# Patient Record
Sex: Male | Born: 1970 | Race: White | Hispanic: No | Marital: Married
Health system: Midwestern US, Community
[De-identification: ages and names within clinical notes are randomized; demographics above are authoritative.]

## PROBLEM LIST (undated history)

## (undated) DIAGNOSIS — A539 Syphilis, unspecified: Secondary | ICD-10-CM

## (undated) DIAGNOSIS — E162 Hypoglycemia, unspecified: Secondary | ICD-10-CM

---

## 2016-09-29 ENCOUNTER — Encounter (HOSPITAL_COMMUNITY): Payer: Self-pay | Admitting: Emergency Medicine

## 2016-09-29 ENCOUNTER — Emergency Department (HOSPITAL_COMMUNITY)
Admission: EM | Admit: 2016-09-29 | Discharge: 2016-09-29 | Disposition: A | Discharge status: Home / Self Care | Payer: Medicare Other | Attending: Emergency Medicine | Admitting: Emergency Medicine

## 2016-09-29 ENCOUNTER — Emergency Department (HOSPITAL_COMMUNITY): Payer: Medicare Other

## 2016-09-29 DIAGNOSIS — W278XXA Contact with other nonpowered hand tool, initial encounter: Secondary | ICD-10-CM | POA: Diagnosis not present

## 2016-09-29 DIAGNOSIS — Y9389 Activity, other specified: Secondary | ICD-10-CM | POA: Insufficient documentation

## 2016-09-29 DIAGNOSIS — Y998 Other external cause status: Secondary | ICD-10-CM | POA: Diagnosis not present

## 2016-09-29 DIAGNOSIS — S61211A Laceration without foreign body of left index finger without damage to nail, initial encounter: Secondary | ICD-10-CM | POA: Diagnosis not present

## 2016-09-29 DIAGNOSIS — F17210 Nicotine dependence, cigarettes, uncomplicated: Secondary | ICD-10-CM | POA: Diagnosis not present

## 2016-09-29 DIAGNOSIS — S60941A Unspecified superficial injury of left index finger, initial encounter: Secondary | ICD-10-CM | POA: Diagnosis present

## 2016-09-29 DIAGNOSIS — Y929 Unspecified place or not applicable: Secondary | ICD-10-CM | POA: Diagnosis not present

## 2016-09-29 MED ORDER — LIDOCAINE HCL (PF) 1 % IJ SOLN
INTRAMUSCULAR | Status: AC
  Filled 2016-09-29: qty 5

## 2016-09-29 NOTE — ED Provider Notes (Signed)
[facility]-EMER[facility]Y DEPT Provider Note   CSN: [id number] Arrival date & time: 09/29/16  2100     History   Chief Complaint Chief Complaint  Patient presents with  . Finger Injury    HPI Issaih Kaus is a 46 y.o. male.  Patient is a 46 year old male who presents to the emergency department with a complaint of injury to his finger.  The patient states that approximately 2 hours prior to his arrival to the emergency department he injured his left index finger with a hammer. He states that his last tetanus shot was about 2 years ago. He denies being on any anticoagulation medications. He denies any recent operations or procedures involving the index finger. No other injury reported. The patient is not taken any medication up to this point for this problem.   The history is provided by the patient.    History reviewed. No pertinent past medical history.  There are no active problems to display for this patient.   History reviewed. No pertinent surgical history.     Home Medications    Prior to Admission medications   Not on File    Family History No family history on file.  Social History Social History  Substance Use Topics  . Smoking status: Current Every Day Smoker    Packs/day: 0.50    Types: Cigarettes  . Smokeless tobacco: Never Used  . Alcohol use No     Allergies   Patient has no allergy information on record.   Review of Systems Review of Systems  Constitutional: Negative for activity change and appetite change.  HENT: Negative for congestion, ear discharge, ear pain, facial swelling, nosebleeds, rhinorrhea, sneezing and tinnitus.   Eyes: Negative for photophobia, pain and discharge.  Respiratory: Negative for cough, choking, shortness of breath and wheezing.   Cardiovascular: Negative for chest pain, palpitations and leg swelling.  Gastrointestinal: Negative for abdominal pain, blood in stool, constipation, diarrhea, nausea and vomiting.    Genitourinary: Negative for difficulty urinating, dysuria, flank pain, frequency and hematuria.  Musculoskeletal: Negative for back pain, gait problem, myalgias and neck pain.  Skin: Negative for color change, rash and wound.  Neurological: Negative for dizziness, seizures, syncope, facial asymmetry, speech difficulty, weakness and numbness.  Hematological: Negative for adenopathy. Does not bruise/bleed easily.  Psychiatric/Behavioral: Negative for agitation, confusion, hallucinations, self-injury and suicidal ideas. The patient is not nervous/anxious.      Physical Exam Updated Vital Signs Ht 6' (1.829 m)   Wt 72.6 kg (160 lb)   BMI 21.70 kg/m   Physical Exam  Constitutional: He is oriented to person, place, and time. He appears well-developed and well-nourished.  Non-toxic appearance.  HENT:  Head: Normocephalic.  Right Ear: Tympanic membrane and external ear normal.  Left Ear: Tympanic membrane and external ear normal.  Eyes: EOM and lids are normal. Pupils are equal, round, and reactive to light.  Neck: Normal range of motion. Neck supple. Carotid bruit is not present.  Cardiovascular: Normal rate, regular rhythm, normal heart sounds, intact distal pulses and normal pulses.   Pulmonary/Chest: Breath sounds normal. No respiratory distress.  Abdominal: Soft. Bowel sounds are normal. There is no tenderness. There is no guarding.  Musculoskeletal: Normal range of motion.  There is full range of motion of the left elbow, and wrist. There is pain with attempted range of motion of the left index finger. There is full range of motion of the other fingers of the left hand. There is a laceration of the dorsal area  of the left index finger between the DIP and PIP joint area. Bleeding is controlled.  Lymphadenopathy:       Head (right side): No submandibular adenopathy present.       Head (left side): No submandibular adenopathy present.    He has no cervical adenopathy.  Neurological: He  is alert and oriented to person, place, and time. He has normal strength. No cranial nerve deficit or sensory deficit.  Skin: Skin is warm and dry.  Psychiatric: He has a normal mood and affect. His speech is normal.  Nursing note and vitals reviewed.    ED Treatments / Results  Labs (all labs ordered are listed, but only abnormal results are displayed) Labs Reviewed - No data to display  EKG  EKG Interpretation None       Radiology No results found.  Procedures .Marland Kitchen.Laceration Repair Date/Time: 09/29/2016 10:03 PM Performed by: Ivery QualeBRYANT, Italia Wolfert Authorized by: Ivery QualeBRYANT, Varun Jourdan   Consent:    Consent obtained:  Verbal   Consent given by:  Patient   Risks discussed:  Infection, pain and poor cosmetic result Anesthesia (see MAR for exact dosages):    Anesthesia method:  Nerve block   Block needle gauge:  25 G   Block anesthetic:  Lidocaine 1% w/o epi   Block injection procedure:  Anatomic landmarks identified, introduced needle, incremental injection, anatomic landmarks palpated and negative aspiration for blood   Block outcome:  Anesthesia achieved Laceration details:    Location:  Finger   Finger location:  L index finger   Length (cm):  2.3 Repair type:    Repair type:  Simple Pre-procedure details:    Preparation:  Patient was prepped and draped in usual sterile fashion Exploration:    Hemostasis achieved with:  Direct pressure   Wound exploration: wound explored through full range of motion     Wound extent: no nerve damage noted, no tendon damage noted and no underlying fracture noted   Treatment:    Area cleansed with:  Betadine   Amount of cleaning:  Standard   Irrigation solution:  Sterile saline Skin repair:    Repair method:  Sutures   Suture size:  4-0   Suture material:  Nylon   Suture technique:  Simple interrupted   Number of sutures:  6 Approximation:    Approximation:  Close Post-procedure details:    Dressing:  Non-adherent dressing   Patient  tolerance of procedure:  Tolerated well, no immediate complications   (including critical care time)  Medications Ordered in ED Medications - No data to display   Initial Impression / Assessment and Plan / ED Course  I have reviewed the triage vital signs and the nursing notes.  Pertinent labs & imaging results that were available during my care of the patient were reviewed by me and considered in my medical decision making (see chart for details).      Final Clinical Impressions(s) / ED Diagnoses MDM Patient injured the left index finger with a hammer. X-ray is negative for fracture or dislocation. The patient's tetanus status is up-to-date on. The wound was repaired with 6 interrupted sutures of 4-0 nylon.  We discussed the importance of keeping the wound clean and dry. Discussed the need to return sooner if any signs of advancing infection. The patient will have the sutures removed in 7 or 8 days. Patient is in agreement with this plan.    Final diagnoses:  Laceration of left index finger without foreign body without damage to nail,  initial encounter    New Prescriptions New Prescriptions   No medications on file     Ke[place], [place] 09/29/16 2207    Donnetta Hutching, MD 10/06/16 [phone]

## 2016-09-29 NOTE — Discharge Instructions (Signed)
Please keep the wound clean and dry. Keep your hand elevated above your above your head is much as possible. This would decrease pain or throbbing. Use Tylenol every 4 hours, or ibuprofen every 6 hours for discomfort. Please have your sutures removed in 7 or 8 days. See your primary physician, or return to the emergency department if any signs of advancing infection.

## 2016-09-29 NOTE — ED Triage Notes (Signed)
Smashed lt index finger with hammer

## 2016-09-29 NOTE — ED Notes (Signed)
Pt smashed finger with a hammer - now with pain and swelling

## 2016-11-24 ENCOUNTER — Emergency Department (HOSPITAL_COMMUNITY): Payer: Medicare Other

## 2016-11-24 ENCOUNTER — Encounter (HOSPITAL_COMMUNITY): Payer: Self-pay | Admitting: Emergency Medicine

## 2016-11-24 ENCOUNTER — Other Ambulatory Visit: Payer: Self-pay

## 2016-11-24 ENCOUNTER — Observation Stay (HOSPITAL_COMMUNITY)
Admission: EM | Admit: 2016-11-24 | Discharge: 2016-11-25 | Disposition: A | Discharge status: Home / Self Care | Payer: Medicare Other | Attending: Internal Medicine | Admitting: Internal Medicine

## 2016-11-24 DIAGNOSIS — R072 Precordial pain: Secondary | ICD-10-CM | POA: Diagnosis not present

## 2016-11-24 DIAGNOSIS — R0602 Shortness of breath: Secondary | ICD-10-CM | POA: Diagnosis not present

## 2016-11-24 DIAGNOSIS — Z720 Tobacco use: Secondary | ICD-10-CM | POA: Diagnosis not present

## 2016-11-24 DIAGNOSIS — F17210 Nicotine dependence, cigarettes, uncomplicated: Secondary | ICD-10-CM | POA: Insufficient documentation

## 2016-11-24 DIAGNOSIS — K219 Gastro-esophageal reflux disease without esophagitis: Secondary | ICD-10-CM

## 2016-11-24 DIAGNOSIS — R079 Chest pain, unspecified: Secondary | ICD-10-CM | POA: Diagnosis present

## 2016-11-24 HISTORY — DX: Hypoglycemia, unspecified: E16.2

## 2016-11-24 HISTORY — DX: Syphilis, unspecified: A53.9

## 2016-11-24 LAB — BASIC METABOLIC PANEL
Anion gap: 8 (ref 5–15)
BUN: 13 mg/dL (ref 6–20)
CALCIUM: 9.5 mg/dL (ref 8.9–10.3)
CO2: 28 mmol/L (ref 22–32)
Chloride: 107 mmol/L (ref 101–111)
Creatinine, Ser: 0.99 mg/dL (ref 0.61–1.24)
GFR calc Af Amer: >60 mL/min (ref >60)
GLUCOSE: 85 mg/dL (ref 65–99)
Potassium: 4.1 mmol/L (ref 3.5–5.1)
Sodium: 143 mmol/L (ref 135–145)

## 2016-11-24 LAB — CBC
HEMATOCRIT: 48 % (ref 39.0–52.0)
HEMOGLOBIN: 16.6 g/dL (ref 13.0–17.0)
MCH: 31.7 pg (ref 26.0–34.0)
MCHC: 34.6 g/dL (ref 30.0–36.0)
MCV: 91.6 fL (ref 78.0–100.0)
Platelets: 271 10*3/uL (ref 150–400)
RBC: 5.24 MIL/uL (ref 4.22–5.81)
RDW: 12.9 % (ref 11.5–15.5)
WBC: 7.1 10*3/uL (ref 4.0–10.5)

## 2016-11-24 LAB — TROPONIN I
Troponin I: <0.03 ng/mL (ref <0.03)
Troponin I: <0.03 ng/mL (ref <0.03)

## 2016-11-24 MED ORDER — MORPHINE SULFATE (PF) 2 MG/ML IV SOLN: 1.0000 mg | INTRAVENOUS | Status: DC | PRN

## 2016-11-24 MED ORDER — PANTOPRAZOLE SODIUM 40 MG PO TBEC
40.0000 mg | DELAYED_RELEASE_TABLET | Freq: Two times a day (BID) | ORAL | Status: DC
  Administered 2016-11-24 – 2016-11-25 (×2): 40 mg via ORAL
  Filled 2016-11-24 (×2): qty 1

## 2016-11-24 MED ORDER — ENOXAPARIN SODIUM 40 MG/0.4ML ~~LOC~~ SOLN
40.0000 mg | SUBCUTANEOUS | Status: DC
  Administered 2016-11-24: 40 mg via SUBCUTANEOUS
  Filled 2016-11-24: qty 0.4

## 2016-11-24 MED ORDER — SUCRALFATE 1 G PO TABS
1.0000 g | ORAL_TABLET | Freq: Three times a day (TID) | ORAL | Status: DC
  Administered 2016-11-24 – 2016-11-25 (×3): 1 g via ORAL
  Filled 2016-11-24 (×3): qty 1

## 2016-11-24 MED ORDER — NITROGLYCERIN 0.4 MG SL SUBL
0.4000 mg | SUBLINGUAL_TABLET | Freq: Once | SUBLINGUAL | Status: AC
  Administered 2016-11-24: 0.4 mg via SUBLINGUAL
  Filled 2016-11-24: qty 1

## 2016-11-24 MED ORDER — ACETAMINOPHEN 325 MG PO TABS
650.0000 mg | ORAL_TABLET | ORAL | Status: DC | PRN
  Administered 2016-11-24: 650 mg via ORAL
  Filled 2016-11-24: qty 2

## 2016-11-24 MED ORDER — ASPIRIN 81 MG PO CHEW
324.0000 mg | CHEWABLE_TABLET | Freq: Once | ORAL | Status: AC
  Administered 2016-11-24: 324 mg via ORAL
  Filled 2016-11-24: qty 4

## 2016-11-24 MED ORDER — ONDANSETRON HCL 4 MG/2ML IJ SOLN: 4.0000 mg | Freq: Four times a day (QID) | INTRAMUSCULAR | Status: DC | PRN

## 2016-11-24 MED ORDER — IPRATROPIUM-ALBUTEROL 0.5-2.5 (3) MG/3ML IN SOLN
3.0000 mL | Freq: Once | RESPIRATORY_TRACT | Status: AC
  Administered 2016-11-24: 3 mL via RESPIRATORY_TRACT
  Filled 2016-11-24: qty 3

## 2016-11-24 NOTE — ED Provider Notes (Addendum)
Emergency Department Provider Note   I have reviewed the triage vital signs and the nursing notes.   HISTORY  Chief Complaint Chest Pain   HPI Dustin Reed is a 46 y.o. male with PMH of tertiary syphilis and tobacco use presents to the emergency room in for evaluation of sudden onset chest tightness, dyspnea, diaphoresis that woke the patient from sleep at approximately 2 AM today. He described some associated heart palpitations. His chest heaviness was across his entire chest, right shoulder, with radiation to the neck. No modifying factors. Patient did not take any over-the-counter medications. Symptoms were severe for the first 2-3 hours and began to resolve. No pleuritic or exertional component to pain. Patient does have some residual heaviness but feels much better than early this morning. He states that he began looking up signs and symptoms of heart attack which got him concerned and he presented to the emergency department. Unknown family as the patient is adopted.    Past Medical History:  Diagnosis Date  . Hypoglycemia   . Syphilis     There are no active problems to display for this patient.   History reviewed. No pertinent surgical history.    Allergies Patient has no known allergies.  Family History  Problem Relation Age of Onset  . Adopted: Yes    Social History Social History  Substance Use Topics  . Smoking status: Current Every Day Smoker    Packs/day: 0.50    Types: Cigarettes  . Smokeless tobacco: Never Used  . Alcohol use No    Review of Systems  Constitutional: No fever/chills. Positive generalized fatigue.  Eyes: No visual changes. ENT: No sore throat. Cardiovascular: Positive chest pain. Respiratory: Positive shortness of breath. Gastrointestinal: No abdominal pain.  No nausea, no vomiting.  No diarrhea.  No constipation. Genitourinary: Negative for dysuria. Musculoskeletal: Negative for back pain. Skin: Negative for  rash. Neurological: Negative for headaches, focal weakness or numbness.  10-point ROS otherwise negative.  ____________________________________________   PHYSICAL EXAM:  VITAL SIGNS: ED Triage Vitals  Enc Vitals Group     BP 11/24/16 1215 121/79     Pulse Rate 11/24/16 1215 65     Resp 11/24/16 1215 18     Temp 11/24/16 1215 97.9 F (36.6 C)     Temp Source 11/24/16 1215 Oral     SpO2 11/24/16 1215 100 %     Weight 11/24/16 1209 160 lb (72.6 kg)     Height 11/24/16 1209 6' (1.829 m)     Pain Score 11/24/16 1208 8   Constitutional: Alert and oriented. Well appearing and in no acute distress. Eyes: Conjunctivae are normal.  Head: Atraumatic. Nose: No congestion/rhinnorhea. Mouth/Throat: Mucous membranes are moist.  Oropharynx non-erythematous. Neck: No stridor.  Cardiovascular: Normal rate, regular rhythm. Good peripheral circulation. Grossly normal heart sounds.   Respiratory: Normal respiratory effort.  No retractions. Lungs CTAB. Gastrointestinal: Soft and nontender. No distention.  Musculoskeletal: No lower extremity tenderness nor edema. No gross deformities of extremities. Neurologic:  Normal speech and language. No gross focal neurologic deficits are appreciated.  Skin:  Skin is warm, dry and intact. No rash noted.   ____________________________________________   LABS (all labs ordered are listed, but only abnormal results are displayed)  Labs Reviewed  BASIC METABOLIC PANEL  CBC  TROPONIN I   ____________________________________________  EKG   EKG Interpretation  Date/Time:  Sunday November 24 2016 12:10:38 EDT Ventricular Rate:  66 PR Interval:  160 QRS Duration: 70 QT Interval:  396 QTC Calculation: 415 R Axis:   24 Text Interpretation:  Normal sinus rhythm Possible Left atrial enlargement Abnormal ECG No STEMI. No old tracing for comparison.  Confirmed by Dustin Reed, Dustin Reed [phone]([id number]) on 11/24/2016 1:04:34 PM        ____________________________________________  RADIOLOGY  Dg Chest 2 View  Result Date: 11/24/2016 CLINICAL DATA:  Chest pain since 0200 hours this morning, LEFT-sided chest pain radiating to LEFT neck and shoulder, associated shortness of breath, nausea, diaphoresis, weakness and lightheadedness, smoker EXAM: CHEST  2 VIEW COMPARISON:  None FINDINGS: Normal heart size, mediastinal contours, and pulmonary vascularity. Emphysematous and bronchitic changes consistent with COPD. No acute infiltrate, pleural effusion or pneumothorax. Bones unremarkable. RIGHT nipple shadow noted, confirmed on lateral view. IMPRESSION: COPD changes without acute infiltrate. LEFT Aortic Atherosclerosis (ICD10-I70.0) and Emphysema (ICD10-J43.9). Electronically Signed   By: Ulyses SouthwardMark  Boles M.D.   On: 11/24/2016 12:45    ____________________________________________   PROCEDURES  Procedure(s) performed:   Procedures  None ____________________________________________   INITIAL IMPRESSION / ASSESSMENT AND PLAN / ED COURSE  Pertinent labs & imaging results that were available during my care of the patient were reviewed by me and considered in my medical decision making (see chart for details).  Patient presents to emergency room in for evaluation of chest pressure radiating to the shoulder and neck that started at 2 AM. It's improved significantly but is still present. Patient had some associated diaphoresis and palpitations during the initial event. Unknown family history. His [name] smoking history and does not have a primary care physician. No known history of hypertension, hyperlipidemia, diabetes. The patient's lung exam is significant for some mild wheezing. Chest x-ray shows changes consistent with COPD but no infiltrate. Plan for troponin, ASA, and Duoneb with some mild dyspnea. Patient with HEART score of 4 (story 1, EKG 1, Age, 1, Risk factors 1).   Differential includes all life-threatening causes for chest  pain. This includes but is not exclusive to acute coronary syndrome, aortic dissection, pulmonary embolism, cardiac tamponade, community-acquired pneumonia, pericarditis, musculoskeletal chest wall pain, etc.  01:51 PM Patient chest pressure relieved with Nitro. Plan for admission for CP evaluation in the setting of moderate risk patient by HEART score. Discussed plan with patient who is in agreement.   Discussed patient's case with Hospitalist, Dr. Ella JubileeArrien. Patient and family (if present) updated with plan. Care transferred to Hospitalist service.  I reviewed all nursing notes, vitals, pertinent old records, EKGs, labs, imaging (as available).  ____________________________________________  FINAL CLINICAL IMPRESSION(S) / ED DIAGNOSES  Final diagnoses:  Precordial chest pain  Shortness of breath     MEDICATIONS GIVEN DURING THIS VISIT:  Medications  aspirin chewable tablet 324 mg (324 mg Oral Given 11/24/16 1310)  ipratropium-albuterol (DUONEB) 0.5-2.5 (3) MG/3ML nebulizer solution 3 mL (3 mLs Nebulization Given 11/24/16 1314)  nitroGLYCERIN (NITROSTAT) SL tablet 0.4 mg (0.4 mg Sublingual Given 11/24/16 1332)     NEW OUTPATIENT MEDICATIONS STARTED DURING THIS VISIT:  None   Note:  This document was prepared using Dragon voice recognition software and may include unintentional dictation errors.  Dustin BeneJoshua Tymeer Vaquera, MD Emergency Medicine    Michaeljoseph Revolorio, Arlyss RepressJoshua G, MD 11/24/16 1352    Maia PlanLong, Graciano Batson G, MD 11/24/16 [phone]

## 2016-11-24 NOTE — H&P (Signed)
History and Physical    Dustin Reed MVH:[id number] DOB: 27-Dec-1970 DOA: 11/24/2016  PCP: Dustin Reed   Patient coming from: Home    Chief Complaint: Chest pain  HPI: Dustin Reed is a 46 y.o. male with significant past medical history. Presented with acute chest pain. The pain was precordial, severe in intensity, it woke him up from his sleep, it stayed constant for hours until he came to the emergency department, it was burning in nature, associated with diaphoresis and dyspnea, radiated to the neck and shoulders. Worse with deep inspiration and mild improvement with analgesics.  Patient is physically active, no angina, PND, or orthopnea.  ED Course: Abnormal ekg, negative troponin, referred for observation.   Review of Systems:  1. General fevers no chills, no waking a weight loss 2. ENT no runny nose or sore throat 3. Pulmonary no shortness of breath, cough or hemoptysis 4. Cardiovascular, no angina claudication 5. Hematology no easy bruisability or frequent infections 6. Endocrine a tremors, heat or cold intolerance 7. Urology no dysuria or increased frequency 8. Skin no rashes 9. Musculoskeletal no joint pain 10. Gastrointestinal, no nausea, no vomiting or diarrhea  Past Medical History:  Diagnosis Date  . Hypoglycemia   . Syphilis     History reviewed. No pertinent surgical history.   reports that he has been smoking Cigarettes.  He has been smoking about 0.50 packs Reed day. He has never used smokeless tobacco. He reports that he does not drink alcohol or use drugs.  No Known Allergies  Family History  Problem Relation Age of Onset  . Adopted: Yes   Unacceptable: Noncontributory, unremarkable, or negative. Acceptable: Family history reviewed and not pertinent (If you reviewed it)  Prior to Admission medications   Not on File    Physical Exam: Vitals:   11/24/16 1241 11/24/16 1300 11/24/16 1315 11/24/16 1349  BP:  134/89  (!) 99/53  Pulse: 68 67  60    Resp: 13 (!) 22  18  Temp:      TempSrc:      SpO2: 100% 100% 100% 98%  Weight:      Height:        Constitutional: not in pain or dyspnea Vitals:   11/24/16 1241 11/24/16 1300 11/24/16 1315 11/24/16 1349  BP:  134/89  (!) 99/53  Pulse: 68 67  60  Resp: 13 (!) 22  18  Temp:      TempSrc:      SpO2: 100% 100% 100% 98%  Weight:      Height:       Eyes: PERRL, lids and conjunctivae normal Head normocephalic, nose and ears no deformities.  ENMT: Mucous membranes are moist. Posterior pharynx clear of any exudate or lesions.Normal dentition.  Neck: normal, supple, no masses, no thyromegaly Respiratory: clear to auscultation bilaterally, no wheezing, no crackles. Normal respiratory effort. No accessory muscle use.  Cardiovascular: Regular rate and rhythm, no murmurs / rubs / gallops. No extremity edema. 2+ pedal pulses. No carotid bruits.  Abdomen: no tenderness, no masses palpated. No hepatosplenomegaly. Bowel sounds positive.  Musculoskeletal: no clubbing / cyanosis. No joint deformity upper and lower extremities. Good ROM, no contractures. Normal muscle tone.  Skin: no rashes, lesions, ulcers. No induration Neurologic: CN 2-12 grossly intact. Sensation intact, DTR normal. Strength 5/5 in all 4.    Labs on Admission: I have personally reviewed following labs and imaging studies  CBC:  Recent Labs Lab 11/24/16 1241  WBC 7.1  HGB  16.6  HCT 48.0  MCV 91.6  PLT 271   Basic Metabolic Panel:  Recent Labs Lab 11/24/16 1241  NA 143  K 4.1  CL 107  CO2 28  GLUCOSE 85  BUN 13  CREATININE 0.99  CALCIUM 9.5   GFR: Estimated Creatinine Clearance: 93.7 mL/min (by C-G formula based on SCr of 0.99 mg/dL). Liver Function Tests: No results for input(s): AST, ALT, ALKPHOS, BILITOT, PROT, ALBUMIN in the last 168 hours. No results for input(s): LIPASE, AMYLASE in the last 168 hours. No results for input(s): AMMONIA in the last 168 hours. Coagulation Profile: No results for  input(s): INR, PROTIME in the last 168 hours. Cardiac Enzymes:  Recent Labs Lab 11/24/16 1241  TROPONINI <0.03   BNP (last 3 results) No results for input(s): PROBNP in the last 8760 hours. HbA1C: No results for input(s): HGBA1C in the last 72 hours. CBG: No results for input(s): GLUCAP in the last 168 hours. Lipid Profile: No results for input(s): CHOL, HDL, LDLCALC, TRIG, CHOLHDL, LDLDIRECT in the last 72 hours. Thyroid Function Tests: No results for input(s): TSH, T4TOTAL, FREET4, T3FREE, THYROIDAB in the last 72 hours. Anemia Panel: No results for input(s): VITAMINB12, FOLATE, FERRITIN, TIBC, IRON, RETICCTPCT in the last 72 hours. Urine analysis: No results found for: COLORURINE, APPEARANCEUR, LABSPEC, PHURINE, GLUCOSEU, HGBUR, BILIRUBINUR, KETONESUR, PROTEINUR, UROBILINOGEN, NITRITE, LEUKOCYTESUR  Radiological Exams on Admission: Dg Chest 2 View  Result Date: 11/24/2016 CLINICAL DATA:  Chest pain since 0200 hours this morning, LEFT-sided chest pain radiating to LEFT neck and shoulder, associated shortness of breath, nausea, diaphoresis, weakness and lightheadedness, smoker EXAM: CHEST  2 VIEW COMPARISON:  None FINDINGS: Normal heart size, mediastinal contours, and pulmonary vascularity. Emphysematous and bronchitic changes consistent with COPD. No acute infiltrate, pleural effusion or pneumothorax. Bones unremarkable. RIGHT nipple shadow noted, confirmed on lateral view. IMPRESSION: COPD changes without acute infiltrate. LEFT Aortic Atherosclerosis (ICD10-I70.0) and Emphysema (ICD10-J43.9). Electronically Signed   By: Ulyses SouthwardMark  Boles M.D.   On: 11/24/2016 12:45    EKG: Independently reviewed. Normal sinus rhythm, normal intervals, normal axis, poor R-wave progression, concave ST elevations in the lateral leads consistent with repolarization changes.  Assessment/Plan Active Problems:   Chest pain   46 year old male with no significant past medical history who presents with acute  episode of chest pain. The pain occurred while he was sleeping, it was 10 out of 10 intensity, burning in nature, right to the shoulders, associated with diaphoresis and dyspnea. Worsening deep inspiration. Patient is physically active, no angina, claudication, PND orthopnea. On initial physical examination blood pressure 108/68, heart rate 53, respiratory rate 14, oxygen saturation 99%, lungs are clear to auscultation bilaterally, heart S1-S2 present rhythmic, no gallop, rubs or murmurs, abdomen soft nontender, no lower extremity edema. Sodium 143, potassium 4.1, chloride 107, bicarbonate 28, glucose 85, BUN 13, creatinine 0.99, troponin less than 0.03, white count 7.1, hemoglobin 16.6, hematocrit 48.0, platelets 271. Chest x-ray negative for infiltrates.  Working diagnosis atypical chest pain, rule out acute coronary syndrome.  1. Atypical chest pain. Patient will be admitted under observation, will do serial cardiac enzymes and serial EKGs. Pain control with morphine, telemetry monitoring. No typical symptoms suggestive of acute coronary syndrome.  2. Suspected GERD. Patient described burning type pain, will start patient with Protonix 40 mg twice daily, sucralfate before meals and at night. Regular diet.  3. Tobacco abuse. Smoking cessation.   DVT prophylaxis: enoxaparin  Code Status:  Full  Family Communication:  Disposition Plan: Home  Consults called:  Admission status: observation    Coralie Keens MD [facility] Pager [phone]  If 7PM-7AM, please contact night-coverage [email or web address] Password [facility]  11/24/2016, 2:08 PM

## 2016-11-24 NOTE — ED Triage Notes (Addendum)
Patient c/o left side chest pain that started this morning at 2am. Per patient pain radiates into left neck and shoulder. Patient reports shortness of breath, nausea, diaphoresis, weakness, and light headedness. Denies any cardia hx.

## 2016-11-25 DIAGNOSIS — K219 Gastro-esophageal reflux disease without esophagitis: Secondary | ICD-10-CM | POA: Diagnosis not present

## 2016-11-25 DIAGNOSIS — R072 Precordial pain: Secondary | ICD-10-CM | POA: Diagnosis not present

## 2016-11-25 DIAGNOSIS — Z720 Tobacco use: Secondary | ICD-10-CM | POA: Diagnosis not present

## 2016-11-25 LAB — TROPONIN I

## 2016-11-25 MED ORDER — FAMOTIDINE 20 MG PO TABS: 20.0000 mg | ORAL_TABLET | Freq: Two times a day (BID) | ORAL | 0 refills | Status: AC

## 2016-11-25 NOTE — Discharge Summary (Signed)
Physician Discharge Summary  Dustin Reed ZOX:[id number] DOB: 11/22/1970 DOA: 11/24/2016  PCP: Patient, No Pcp Per  Admit date: 11/24/2016 Discharge date: 11/25/2016  Admitted From: Home  Disposition:  Home   Recommendations for Outpatient Follow-up:  1. Follow up with PCP in 1- weeks   Home Health: No  Equipment/Devices: No   Discharge Condition: Stable  CODE STATUS: Full  Diet recommendation: Regular  Brief/Interim Summary: 46 year old male with no significant past medical history who presents with acute episode of chest pain. The pain occurred while he was sleeping, it was 10 out of 10 intensity, burning in nature, radiated to the shoulders, associated with diaphoresis and dyspnea. Worsening with deep inspiration. Patient is physically active, no angina, claudication, PND orthopnea. On initial physical examination blood pressure 108/68, heart rate 53, respiratory rate 14, oxygen saturation 99%, lungs were clear to auscultation bilaterally, heart S1-S2 present rhythmic, no gallop, rubs or murmurs, abdomen soft nontender, no lower extremity edema. Sodium 143, potassium 4.1, chloride 107, bicarbonate 28, glucose 85, BUN 13, creatinine 0.99, troponin less than 0.03, white count 7.1, hemoglobin 16.6, hematocrit 48.0, platelets 271. Chest x-ray negative for infiltrates.  Working diagnosis atypical chest pain, rule out acute coronary syndrome.  1. Atypical chest pain. Patient was admitted to the hospital, placed on remote telemetry monitoring, serial cardiac enzymes were negative, EKG was negative, he ruled out for acute coronary syndrome. His symptoms were more likely related to acid reflux, he was placed on antiacid therapy with improvement of his complaints.  2. Suspected GERD. Patient will be discharged on famotidine twice daily, close follow-up as an outpatient, he received Protonix and sucralfate during his hospitalization.  3. Tobacco abuse. Smoking cessation.    Discharge Diagnoses:   Active Problems:   Chest pain    Discharge Instructions   Allergies as of 11/25/2016   No Known Allergies     Medication List    TAKE these medications   famotidine 20 MG tablet Commonly known as:  PEPCID Take 1 tablet (20 mg total) by mouth 2 (two) times daily.       No Known Allergies  Consultations:     Procedures/Studies: Dg Chest 2 View  Result Date: 11/24/2016 CLINICAL DATA:  Chest pain since 0200 hours this morning, LEFT-sided chest pain radiating to LEFT neck and shoulder, associated shortness of breath, nausea, diaphoresis, weakness and lightheadedness, smoker EXAM: CHEST  2 VIEW COMPARISON:  None FINDINGS: Normal heart size, mediastinal contours, and pulmonary vascularity. Emphysematous and bronchitic changes consistent with COPD. No acute infiltrate, pleural effusion or pneumothorax. Bones unremarkable. RIGHT nipple shadow noted, confirmed on lateral view. IMPRESSION: COPD changes without acute infiltrate. LEFT Aortic Atherosclerosis (ICD10-I70.0) and Emphysema (ICD10-J43.9). Electronically Signed   By: Ulyses SouthwardMark  Boles M.D.   On: 11/24/2016 12:45      Subjective: Patient feeling better, no nausea or vomiting, no further chest pain.   Discharge Exam: Vitals:   11/25/16 0445 11/25/16 0800  BP: 123/75 127/74  Pulse: 83 81  Resp: 16 18  Temp: 97.8 F (36.6 C) 98.1 F (36.7 C)   Vitals:   11/24/16 1645 11/24/16 2045 11/25/16 0445 11/25/16 0800  BP: (!) 111/56 (!) 101/56 123/75 127/74  Pulse: (!) 53 (!) 56 83 81  Resp: 16  16 18   Temp: 98.2 F (36.8 C) 98.1 F (36.7 C) 97.8 F (36.6 C) 98.1 F (36.7 C)  TempSrc: Oral Oral Oral Oral  SpO2: 100% 99% 100% 100%  Weight: 65.3 kg (144 lb)     Height:  6' (1.829 m)       General: Pt is alert, awake, not in acute distress E ENT. No pallor or icterus, oral mucosa moist Cardiovascular: RRR, S1/S2 +, no rubs, no gallops Respiratory: CTA bilaterally, no wheezing, no rhonchi Abdominal: Soft, NT, ND, bowel  sounds + Extremities: no edema, no cyanosis    The results of significant diagnostics from this hospitalization (including imaging, microbiology, ancillary and laboratory) are listed below for reference.     Microbiology: No results found for this or any previous visit (from the past 240 hour(s)).   Labs: BNP (last 3 results) No results for input(s): BNP in the last 8760 hours. Basic Metabolic Panel:  Recent Labs Lab 11/24/16 1241  NA 143  K 4.1  CL 107  CO2 28  GLUCOSE 85  BUN 13  CREATININE 0.99  CALCIUM 9.5   Liver Function Tests: No results for input(s): AST, ALT, ALKPHOS, BILITOT, PROT, ALBUMIN in the last 168 hours. No results for input(s): LIPASE, AMYLASE in the last 168 hours. No results for input(s): AMMONIA in the last 168 hours. CBC:  Recent Labs Lab 11/24/16 1241  WBC 7.1  HGB 16.6  HCT 48.0  MCV 91.6  PLT 271   Cardiac Enzymes:  Recent Labs Lab 11/24/16 1241 11/24/16 1900 11/24/16 2222 11/25/16 0222  TROPONINI <0.03 <0.03 <0.03 <0.03   BNP: Invalid input(s): POCBNP CBG: No results for input(s): GLUCAP in the last 168 hours. D-Dimer No results for input(s): DDIMER in the last 72 hours. Hgb A1c No results for input(s): HGBA1C in the last 72 hours. Lipid Profile No results for input(s): CHOL, HDL, LDLCALC, TRIG, CHOLHDL, LDLDIRECT in the last 72 hours. Thyroid function studies No results for input(s): TSH, T4TOTAL, T3FREE, THYROIDAB in the last 72 hours.  Invalid input(s): FREET3 Anemia work up No results for input(s): VITAMINB12, FOLATE, FERRITIN, TIBC, IRON, RETICCTPCT in the last 72 hours. Urinalysis No results found for: COLORURINE, APPEARANCEUR, LABSPEC, PHURINE, GLUCOSEU, HGBUR, BILIRUBINUR, KETONESUR, PROTEINUR, UROBILINOGEN, NITRITE, LEUKOCYTESUR Sepsis Labs Invalid input(s): PROCALCITONIN,  WBC,  LACTICIDVEN Microbiology No results found for this or any previous visit (from the past 240 hour(s)).   Time coordinating  discharge: 45 minutes  SIGNED:   Coralie Keens, MD  [facility] 11/25/2016, 12:00 PM Pager   If 7PM-7AM, please contact night-coverage [email or web address] Password [email or web address]

## 2016-11-25 NOTE — Care Management Note (Signed)
Case Management Note  Patient Details  Name: Dustin HolsterBryan Dunker MRN: [id number] Date of Birth: 02/19/1971  Subjective/Objective:                  Admitted with CP. From home, ind pta. [place] [facility] [place] [facility] but [facility] has no [facility]. Pt has no difficulty affording or managing medications. No difficulty getting to appointments.   Action/Plan: Discharging home today. PCP list given, pt understands he must contact office of choice provider to establish care.   Expected Discharge Date:     11/25/2016             Expected Discharge Plan:  Home/Self Care  In-House Referral:  NA  Discharge planning Services  CM Consult  Post Acute Care Choice:  NA Choice offered to:  NA  Status of Service:  Completed, signed off  Malcolm MetroChildress, Dontel Harshberger Demske, RN 11/25/2016, 11:51 AM

## 2016-11-25 NOTE — Progress Notes (Signed)
IV removed, WNL. D/c instructions given to pt. Verbalized understanding.

## 2016-11-25 NOTE — Care Management Obs Status (Signed)
MEDICARE OBSERVATION STATUS NOTIFICATION   Patient Details  Name: Dustin Reed MRN: [id number] Date of Birth: 02/09/1971   Medicare Observation Status Notification Given:  Yes    Malcolm MetroChildress, Adria Costley Demske, RN 11/25/2016, 11:50 AM

## 2016-11-26 LAB — HIV ANTIBODY (ROUTINE TESTING W REFLEX): HIV Screen 4th Generation wRfx: NONREACTIVE

## 2017-09-20 ENCOUNTER — Encounter (HOSPITAL_COMMUNITY): Payer: Self-pay | Admitting: Emergency Medicine

## 2017-09-20 ENCOUNTER — Other Ambulatory Visit: Payer: Self-pay

## 2017-09-20 ENCOUNTER — Emergency Department (HOSPITAL_COMMUNITY): Payer: Medicare Other

## 2017-09-20 ENCOUNTER — Emergency Department (HOSPITAL_COMMUNITY)
Admission: EM | Admit: 2017-09-20 | Discharge: 2017-09-20 | Discharge status: Against Medical Advice | Payer: Medicare Other | Attending: Emergency Medicine | Admitting: Emergency Medicine

## 2017-09-20 DIAGNOSIS — Z5321 Procedure and treatment not carried out due to patient leaving prior to being seen by health care provider: Secondary | ICD-10-CM | POA: Diagnosis not present

## 2017-09-20 DIAGNOSIS — N5089 Other specified disorders of the male genital organs: Secondary | ICD-10-CM | POA: Insufficient documentation

## 2017-09-20 DIAGNOSIS — R1030 Lower abdominal pain, unspecified: Secondary | ICD-10-CM | POA: Insufficient documentation

## 2017-09-20 LAB — CBC WITH DIFFERENTIAL/PLATELET
BASOS ABS: 0 10*3/uL (ref 0.0–0.1)
BASOS PCT: 0 %
Eosinophils Absolute: 0.1 10*3/uL (ref 0.0–0.7)
Eosinophils Relative: 1 %
HEMATOCRIT: 44.3 % (ref 39.0–52.0)
Hemoglobin: 15.3 g/dL (ref 13.0–17.0)
Lymphocytes Relative: 12 %
Lymphs Abs: 1.2 10*3/uL (ref 0.7–4.0)
MCH: 31.4 pg (ref 26.0–34.0)
MCHC: 34.5 g/dL (ref 30.0–36.0)
MCV: 91 fL (ref 78.0–100.0)
MONO ABS: 0.9 10*3/uL (ref 0.1–1.0)
Monocytes Relative: 9 %
NEUTROS ABS: 7.2 10*3/uL (ref 1.7–7.7)
NEUTROS PCT: 78 %
Platelets: 246 10*3/uL (ref 150–400)
RBC: 4.87 MIL/uL (ref 4.22–5.81)
RDW: 12.9 % (ref 11.5–15.5)
WBC: 9.3 10*3/uL (ref 4.0–10.5)

## 2017-09-20 LAB — URINALYSIS, ROUTINE W REFLEX MICROSCOPIC
BILIRUBIN URINE: NEGATIVE
Bacteria, UA: NONE SEEN
Glucose, UA: NEGATIVE mg/dL
Ketones, ur: NEGATIVE mg/dL
NITRITE: NEGATIVE
PH: 6 (ref 5.0–8.0)
Protein, ur: NEGATIVE mg/dL
SPECIFIC GRAVITY, URINE: 1.015 (ref 1.005–1.030)

## 2017-09-20 LAB — BASIC METABOLIC PANEL
ANION GAP: 8 (ref 5–15)
BUN: 10 mg/dL (ref 6–20)
CO2: 25 mmol/L (ref 22–32)
Calcium: 9 mg/dL (ref 8.9–10.3)
Chloride: 101 mmol/L (ref 101–111)
Creatinine, Ser: 0.87 mg/dL (ref 0.61–1.24)
GLUCOSE: 102 mg/dL — AB (ref 65–99)
POTASSIUM: 4.2 mmol/L (ref 3.5–5.1)
SODIUM: 134 mmol/L — AB (ref 135–145)

## 2017-09-20 MED ORDER — KETOROLAC TROMETHAMINE 30 MG/ML IJ SOLN
30.0000 mg | Freq: Once | INTRAMUSCULAR | Status: AC
Start: 2017-09-20 — End: 2017-09-20
  Administered 2017-09-20: 30 mg via INTRAVENOUS
  Filled 2017-09-20: qty 1

## 2017-09-20 MED ORDER — FENTANYL CITRATE (PF) 100 MCG/2ML IJ SOLN
50.0000 ug | INTRAMUSCULAR | Status: DC | PRN
  Administered 2017-09-20: 50 ug via INTRAVENOUS
  Filled 2017-09-20: qty 2

## 2017-09-20 NOTE — ED Notes (Signed)
Patient ambulated to restroom without assistance. Patient states that swelling is still the same. Patient states that pain is down from when he arrived to ED.

## 2017-09-20 NOTE — ED Notes (Signed)
Family at bedside. 

## 2017-09-20 NOTE — ED Provider Notes (Signed)
[facility] EMERGENCY DEPARTMENT Provider Note   CSN: [id number] Arrival date & time: 09/20/17  1437     History   Chief Complaint No chief complaint on file.   HPI Dustin Reed is a 47 y.o. male.  HPI  Pt was seen at 1550. Per pt, c/o gradual onset and worsening of persistent right testicle "pain" and "swelling" for the past 5 days. Has been associated with right sided abd and low back "pain." States he has had this previously "and it got better on it's own." Denies injury, no fevers, no N/V/D, no dysuria/hematuria, no penile drainage.   Past Medical History:  Diagnosis Date  . Hypoglycemia   . Syphilis     Patient Active Problem List   Diagnosis Date Noted  . Chest pain 11/24/2016    History reviewed. No pertinent surgical history.      Home Medications    Prior to Admission medications   Medication Sig Start Date End Date Taking? Authorizing Provider  famotidine (PEPCID) 20 MG tablet Take 1 tablet (20 mg total) by mouth 2 (two) times daily. 11/25/16 12/25/16  Arrien, York Ram, MD    Family History Family History  Adopted: Yes    Social History Social History   Tobacco Use  . Smoking status: Current Every Day Smoker    Packs/day: 0.50    Types: Cigarettes  . Smokeless tobacco: Never Used  Substance Use Topics  . Alcohol use: No  . Drug use: No     Allergies   Patient has no known allergies.   Review of Systems Review of Systems ROS: Statement: All systems negative except as marked or noted in the HPI; Constitutional: Negative for fever and chills. ; ; Eyes: Negative for eye pain, redness and discharge. ; ; ENMT: Negative for ear pain, hoarseness, nasal congestion, sinus pressure and sore throat. ; ; Cardiovascular: Negative for chest pain, palpitations, diaphoresis, dyspnea and peripheral edema. ; ; Respiratory: Negative for cough, wheezing and stridor. ; ; Gastrointestinal: Negative for nausea, vomiting, diarrhea, abdominal pain, blood in  stool, hematemesis, jaundice and rectal bleeding. . ; ; Genitourinary: Negative for dysuria, flank pain and hematuria. ; ; Genital:  No penile drainage or rash, +right testicular pain and swelling, no scrotal swelling. ;; Musculoskeletal: Negative for back pain and neck pain. Negative for swelling and trauma.; ; Skin: Negative for pruritus, rash, abrasions, blisters, bruising and skin lesion.; ; Neuro: Negative for headache, lightheadedness and neck stiffness. Negative for weakness, altered level of consciousness, altered mental status, extremity weakness, paresthesias, involuntary movement, seizure and syncope.       Physical Exam Updated Vital Signs BP 121/77 (BP Location: Left Arm)   Pulse 93   Temp 98.2 F (36.8 C) (Oral)   Resp 19   Ht 6' (1.829 m)   Wt 70.3 kg (155 lb)   SpO2 98%   BMI 21.02 kg/m   Physical Exam 1555: Physical examination:  Nursing notes reviewed; Vital signs and O2 SAT reviewed;  Constitutional: Well developed, Well nourished, Well hydrated, Uncomfortable appearing.; Head:  Normocephalic, atraumatic; Eyes: EOMI, PERRL, No scleral icterus; ENMT: Mouth and pharynx normal, Mucous membranes moist; Neck: Supple, Full range of motion, No lymphadenopathy; Cardiovascular: Regular rate and rhythm, No gallop; Respiratory: Breath sounds clear & equal bilaterally, No wheezes.  Speaking full sentences with ease, Normal respiratory effort/excursion; Chest: Nontender, Movement normal; Abdomen: Soft, +right sided tenderness to palp. No rebound or guarding. Nondistended, Normal bowel sounds; Genitourinary: No CVA tenderness.  Genital exam performed  with pt permission and male ED RN chaperone present during exam. No perineal erythema.  No penile lesions or drainage.  No scrotal edema.  Normal testicular lie.  +right testicle with firm edema, tenderness to palp, overlying scrotal erythema. No ecchymosis, no soft tissue crepitus, no open wounds.  +cremasteric reflex left.  No inguinal LAN or  palpable masses bilat.;; Spine:  No midline CS, TS, LS tenderness. +TTP right lumbar paraspinal muscles.;; Extremities: Peripheral pulses normal, No tenderness, No edema, No calf edema or asymmetry.; Neuro: AA&Ox3, Major CN grossly intact.  Speech clear. No gross focal motor or sensory deficits in extremities.; Skin: Color normal, Warm, Dry.    ED Treatments / Results  Labs (all labs ordered are listed, but only abnormal results are displayed)   EKG None  Radiology   Procedures Procedures (including critical care time)  Medications Ordered in ED Medications  fentaNYL (SUBLIMAZE) injection 50 mcg (has no administration in time range)     Initial Impression / Assessment and Plan / ED Course  I have reviewed the triage vital signs and the nursing notes.  Pertinent labs & imaging results that were available during my care of the patient were reviewed by me and considered in my medical decision making (see chart for details).  MDM Reviewed: previous chart, nursing note and vitals Reviewed previous: labs Interpretation: labs, ultrasound and CT scan   Results for orders placed or performed during the hospital encounter of 09/20/17  Basic metabolic panel  Result Value Ref Range   Sodium 134 (L) 135 - 145 mmol/L   Potassium 4.2 3.5 - 5.1 mmol/L   Chloride 101 101 - 111 mmol/L   CO2 25 22 - 32 mmol/L   Glucose, Bld 102 (H) 65 - 99 mg/dL   BUN 10 6 - 20 mg/dL   Creatinine, Ser 1.61 0.61 - 1.24 mg/dL   Calcium 9.0 8.9 - 09.6 mg/dL   GFR calc non Af Amer >60 >60 mL/min   GFR calc Af Amer >60 >60 mL/min   Anion gap 8 5 - 15  CBC with Differential  Result Value Ref Range   WBC 9.3 4.0 - 10.5 K/uL   RBC 4.87 4.22 - 5.81 MIL/uL   Hemoglobin 15.3 13.0 - 17.0 g/dL   HCT 04.5 40.9 - 81.1 %   MCV 91.0 78.0 - 100.0 fL   MCH 31.4 26.0 - 34.0 pg   MCHC 34.5 30.0 - 36.0 g/dL   RDW 91.4 78.2 - 95.6 %   Platelets 246 150 - 400 K/uL   Neutrophils Relative % 78 %   Neutro Abs 7.2 1.7  - 7.7 K/uL   Lymphocytes Relative 12 %   Lymphs Abs 1.2 0.7 - 4.0 K/uL   Monocytes Relative 9 %   Monocytes Absolute 0.9 0.1 - 1.0 K/uL   Eosinophils Relative 1 %   Eosinophils Absolute 0.1 0.0 - 0.7 K/uL   Basophils Relative 0 %   Basophils Absolute 0.0 0.0 - 0.1 K/uL   Ct Renal Stone Study Result Date: 09/20/2017 CLINICAL DATA:  47 year old male with acute RIGHT abdominal, pelvic and inguinal pain. EXAM: CT ABDOMEN AND PELVIS WITHOUT CONTRAST TECHNIQUE: Multidetector CT imaging of the abdomen and pelvis was performed following the standard protocol without IV contrast. COMPARISON:  None. FINDINGS: Please note that parenchymal abnormalities may be missed without intravenous contrast. Lower chest: No acute abnormality. Hepatobiliary: The liver and gallbladder are unremarkable. No biliary dilatation. Pancreas: Unremarkable Spleen: Unremarkable Adrenals/Urinary Tract: The kidneys, adrenal glands and  bladder are unremarkable. Stomach/Bowel: Stomach is within normal limits. Appendix appears normal. No evidence of bowel wall thickening, distention, or inflammatory changes. Vascular/Lymphatic: Aortic atherosclerosis. No enlarged abdominal or pelvic lymph nodes. Reproductive: A RIGHT scrotal hydrocele is noted. Prostate is unremarkable. Other: No ascites, focal collection or pneumoperitoneum. Musculoskeletal: No acute or significant osseous findings. IMPRESSION: 1. RIGHT scrotal hydrocele.  No other acute abnormalities noted. 2.  Aortic Atherosclerosis (ICD10-I70.0). Electronically Signed   By: Harmon Pier M.D.   On: 09/20/2017 17:14   US Scrotum W/doppler Result Date: 09/20/2017 CLINICAL DATA:  Right testicle swelling for the past 5 days. EXAM: SCROTAL ULTRASOUND DOPPLER ULTRASOUND OF THE TESTICLES TECHNIQUE: Complete ultrasound examination of the testicles, epididymis, and other scrotal structures was performed. Color and spectral Doppler ultrasound were also utilized to evaluate blood flow to the  testicles. COMPARISON:  None. FINDINGS: Right testicle Measurements: 3.7 x 2.7 x 3.2 cm. No mass or microlithiasis visualized. Left testicle Measurements: 3.7 x 1.9 x 2.8 cm. No mass or microlithiasis visualized. Right epididymis: Normal in size. Simple appearing 1.8 cm cyst near the epididymal head. Left epididymis:  Normal in size and appearance. Hydrocele: Large predominantly simple appearing right hydrocele with a few thin internal septations. Varicocele:  None visualized. Pulsed Doppler interrogation of both testes demonstrates normal low resistance arterial and venous waveforms bilaterally. IMPRESSION: 1. Large right hydrocele. Normal sonographic appearance of the testicles. 2. Right epididymal head cyst. Electronically Signed   By: Obie Dredge M.D.   On: 09/20/2017 17:03      1600:  T/C returned from [facility] Marlou Porch, case discussed, including:  HPI, pertinent PM/SHx, VS/PE, dx testing, ED course and treatment:  Agrees that testicular torsion would be unusual in this age group and persist for this many days, more likely epididymo-orchitis; while workup is progressing, OK to give toradol and place ice pack; if dx testing reveals same, then continue motrin, abx x10 days, ice, elevate, f/u office; call back prn.  2015:  Pt has been sleeping most of his ED visit. Pt asked to give urine sample. Family also aware of need for urine sample.  Pt then was seen walking down the hallway, leaving the ED. ED staff caught up to pt and removed IV; making him aware he was leaving AMA before dx testing and tx completed. Pt stated he "didn't care" and left the ED.      Final Clinical Impressions(s) / ED Diagnoses   Final diagnoses:  Swollen testicle    ED Discharge Orders    None       Samuel Jester, DO 09/24/17 1610

## 2017-09-20 NOTE — ED Notes (Signed)
Pt and pt's friend seen leaving dept, stopped pt and asked about IV, informed pt that he can not leave with IV in arm and that it is considered leaving AMA, pt verbalized understanding.  This nurse pulled IV-catheter intact, site wnl and dsg applied to site.  EDP noted that pt was leaving as well.

## 2017-09-20 NOTE — ED Triage Notes (Signed)
Right testicle has been swollen x 5 days

## 2017-09-20 NOTE — ED Notes (Signed)
Pt left without signing AMA form.

## 2017-09-22 LAB — URINE CULTURE: Culture: NO GROWTH

## 2018-01-14 ENCOUNTER — Ambulatory Visit: Payer: Medicare Other | Admitting: Urology

## 2018-02-02 NOTE — Progress Notes (Deleted)
Psychiatric Initial Adult Assessment   Patient Identification: Dustin Reed MRN:  [id number] Date of Evaluation:  02/02/2018 Referral Source: Marylynn Pearson, FNP Chief Complaint:   Visit Diagnosis: No diagnosis found.  History of Present Illness:   Dustin Reed is a 47 y.o. year old male with a history of schizophrenia, PTSD per chart, who is referred for schizophrenia.      Associated Signs/Symptoms: Depression Symptoms:  {DEPRESSION SYMPTOMS:20000} (Hypo) Manic Symptoms:  {BHH MANIC SYMPTOMS:22872} Anxiety Symptoms:  {BHH ANXIETY SYMPTOMS:22873} Psychotic Symptoms:  {BHH PSYCHOTIC SYMPTOMS:22874} PTSD Symptoms: {BHH PTSD SYMPTOMS:22875}  Past Psychiatric History:  Outpatient:  Psychiatry admission:  Previous suicide attempt:  Past trials of medication:  History of violence:   Previous Psychotropic Medications: {YES/NO:21197}  Substance Abuse History in the last 12 months:  {yes no:314532}  Consequences of Substance Abuse: {BHH CONSEQUENCES OF SUBSTANCE ABUSE:22880}  Past Medical History:  Past Medical History:  Diagnosis Date  . Hypoglycemia   . Syphilis    No past surgical history on file.  Family Psychiatric History: ***  Family History:  Family History  Adopted: Yes    Social History:   Social History   Socioeconomic History  . Marital status: Married    Spouse name: Not on file  . Number of children: Not on file  . Years of education: Not on file  . Highest education level: Not on file  Occupational History  . Not on file  Social Needs  . Financial resource strain: Not on file  . Food insecurity:    Worry: Not on file    Inability: Not on file  . Transportation needs:    Medical: Not on file    Non-medical: Not on file  Tobacco Use  . Smoking status: Current Every Day Smoker    Packs/day: 0.50    Types: Cigarettes  . Smokeless tobacco: Never Used  Substance and Sexual Activity  . Alcohol use: No  . Drug use: No  . Sexual activity: Not  on file  Lifestyle  . Physical activity:    Days per week: Not on file    Minutes per session: Not on file  . Stress: Not on file  Relationships  . Social connections:    Talks on phone: Not on file    Gets together: Not on file    Attends religious service: Not on file    Active member of club or organization: Not on file    Attends meetings of clubs or organizations: Not on file    Relationship status: Not on file  Other Topics Concern  . Not on file  Social History Narrative  . Not on file    Additional Social History: ***  Allergies:  No Known Allergies  Metabolic Disorder Labs: No results found for: HGBA1C, MPG No results found for: PROLACTIN No results found for: CHOL, TRIG, HDL, CHOLHDL, VLDL, LDLCALC   Current Medications: Current Outpatient Medications  Medication Sig Dispense Refill  . famotidine (PEPCID) 20 MG tablet Take 1 tablet (20 mg total) by mouth 2 (two) times daily. 60 tablet 0   No current facility-administered medications for this visit.     Neurologic: Headache: No Seizure: No Paresthesias:No  Musculoskeletal: Strength & Muscle Tone: within normal limits Gait & Station: normal Patient leans: N/A  Psychiatric Specialty Exam: ROS  There were no vitals taken for this visit.There is no height or weight on file to calculate BMI.  General Appearance: Fairly Groomed  Eye Contact:  Good  Speech:  Clear  and Coherent  Volume:  Normal  Mood:  {BHH MOOD:22306}  Affect:  {Affect (PAA):22687}  Thought Process:  Coherent  Orientation:  Full (Time, Place, and Person)  Thought Content:  Logical  Suicidal Thoughts:  {ST/HT (PAA):22692}  Homicidal Thoughts:  {ST/HT (PAA):22692}  Memory:  Immediate;   Good  Judgement:  {Judgement (PAA):22694}  Insight:  {Insight (PAA):22695}  Psychomotor Activity:  Normal  Concentration:  Concentration: Good and Attention Span: Good  Recall:  Good  Fund of Knowledge:Good  Language: Good  Akathisia:  No  Handed:   Right  AIMS (if indicated):  N/A  Assets:  Communication Skills Desire for Improvement  ADL's:  Intact  Cognition: WNL  Sleep:  ***   Assessment  Plan  The patient demonstrates the following risk factors for suicide: Chronic risk factors for suicide include: {Chronic Risk Factors for ZOXWRUE:45409811}. Acute risk factors for suicide include: {Acute Risk Factors for BJYNWGN:56213086}. Protective factors for this patient include: {Protective Factors for Suicide VHQI:69629528}. Considering these factors, the overall suicide risk at this point appears to be {Desc; low/moderate/high:110033}. Patient {ACTION; IS/IS UXL:24401027} appropriate for outpatient follow up.   Treatment Plan Summary: Plan as above   Neysa Hotter, MD 9/30/201911:39 AM

## 2018-02-06 ENCOUNTER — Ambulatory Visit (HOSPITAL_COMMUNITY): Payer: Medicare Other | Admitting: Psychiatry

## 2019-05-15 IMAGING — DX DG CHEST 2V
3 series · 3 of 3 positions shown · non-contrast
Comparison: None

CLINICAL DATA: Chest pain since 1711 hours this morning, LEFT-sided
chest pain radiating to LEFT neck and shoulder, associated shortness
of breath, nausea, diaphoresis, weakness and lightheadedness, smoker

EXAM:
CHEST  2 VIEW

[chest lat (1 of 2)]
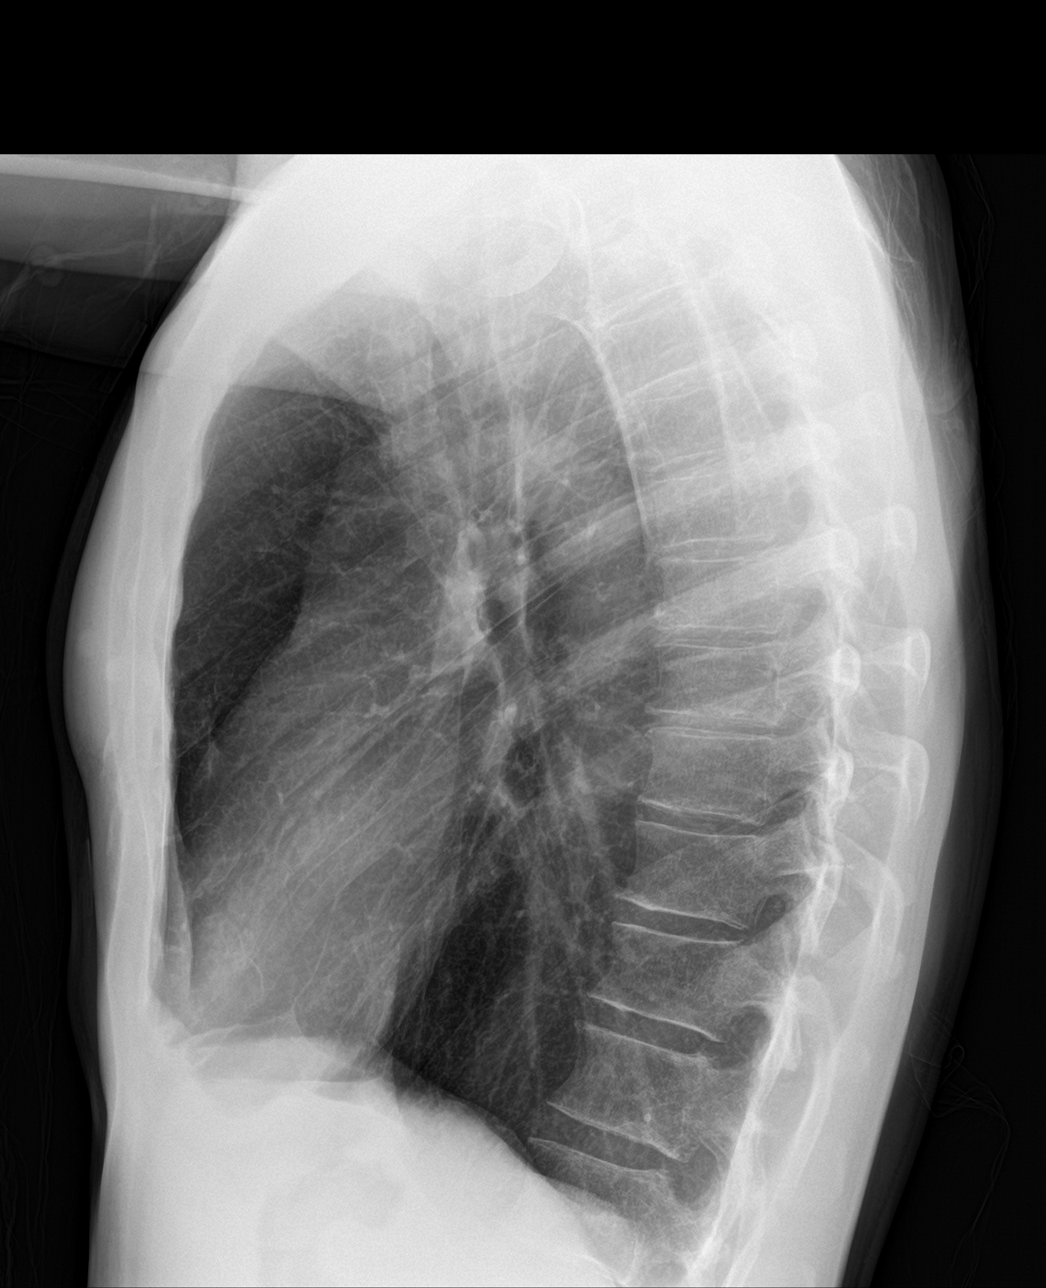

[chest pa]
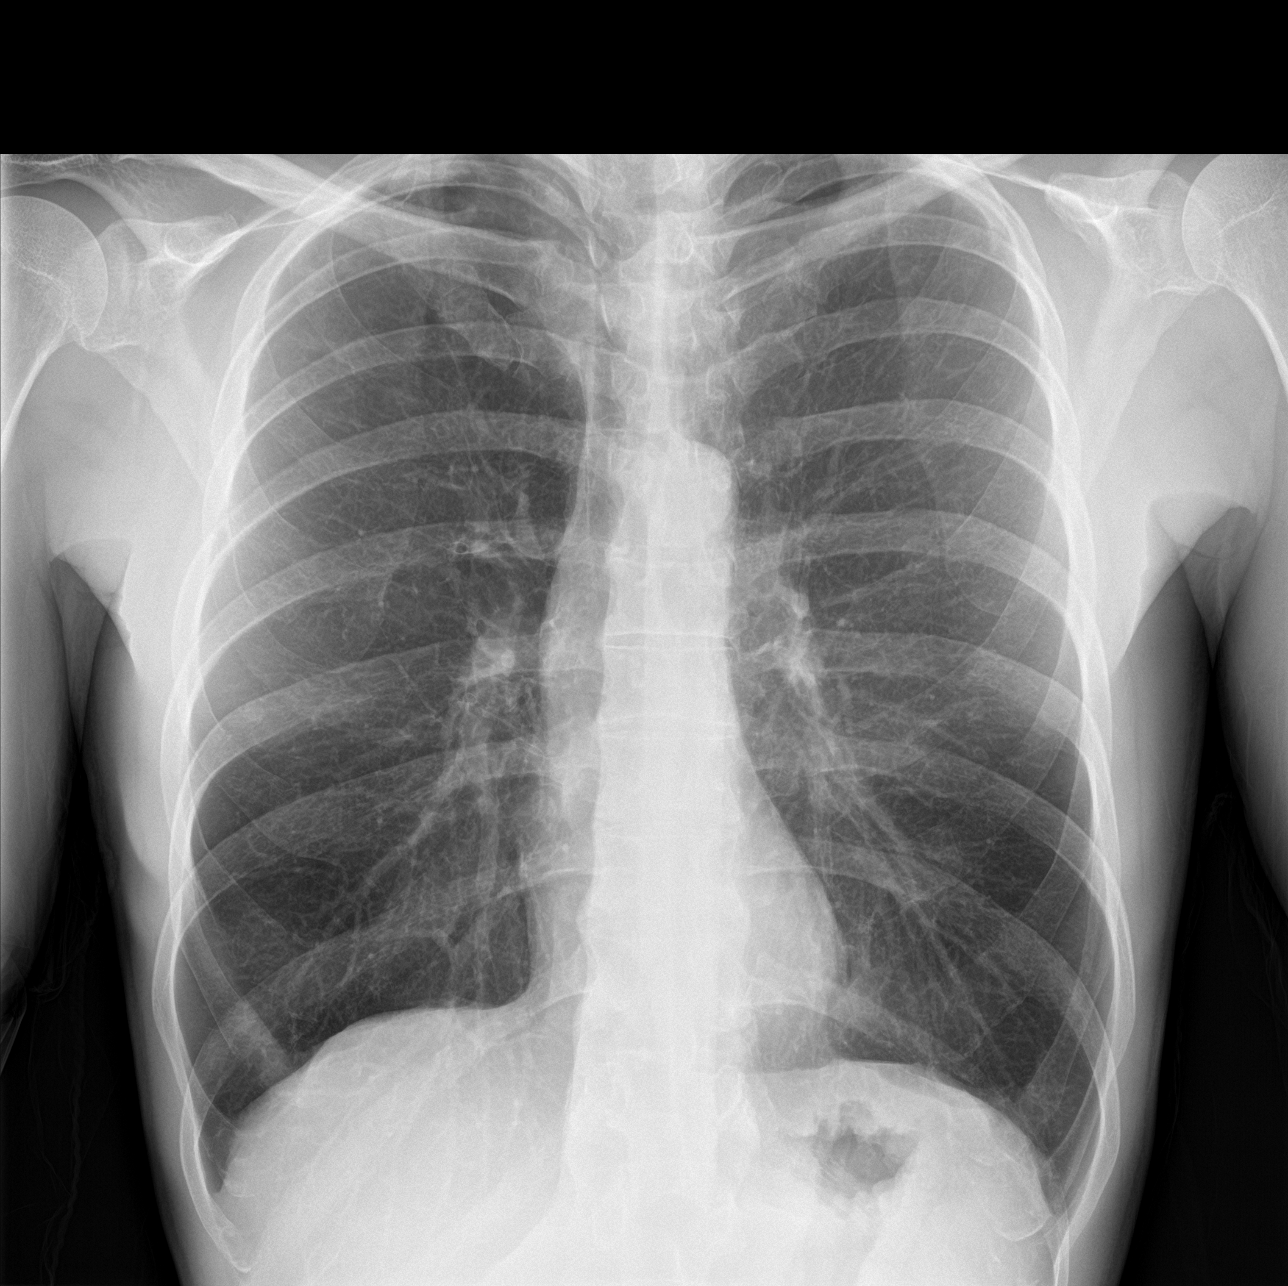

[chest lat (2 of 2)]
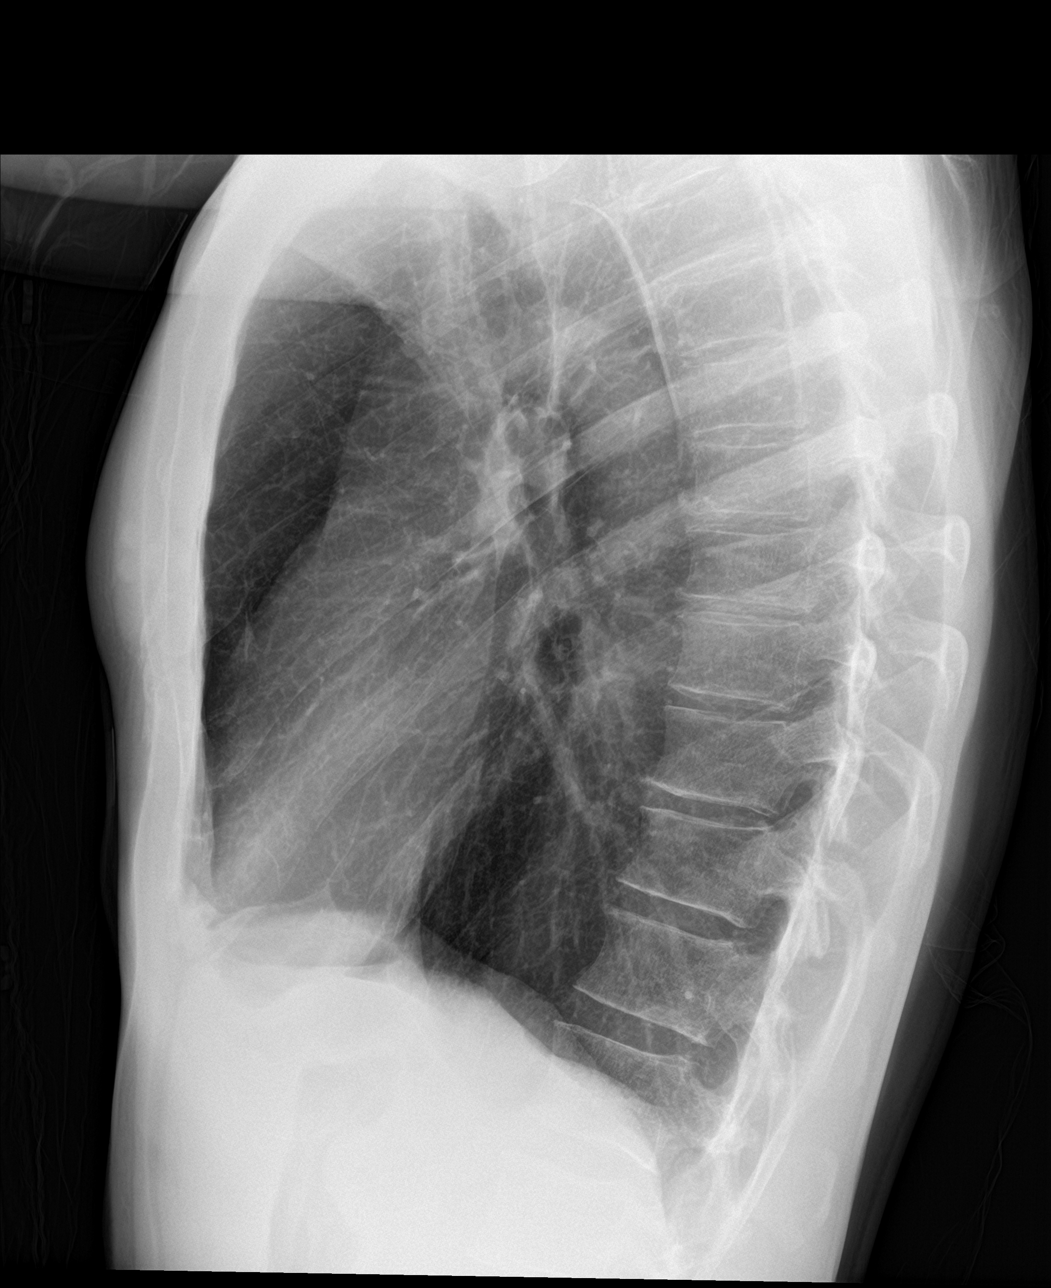

[3 of 3 positions shown; findings below may reference images not displayed]

FINDINGS: Normal heart size, mediastinal contours, and pulmonary vascularity.

Emphysematous and bronchitic changes consistent with COPD.

No acute infiltrate, pleural effusion or pneumothorax.

Bones unremarkable.

RIGHT nipple shadow noted, confirmed on lateral view.
IMPRESSION: COPD changes without acute infiltrate.

LEFT Aortic Atherosclerosis (JBAU0-1Y8.8) and Emphysema
(JBAU0-Y18.S).

## 2022-10-02 ENCOUNTER — Emergency Department: Admit: 2022-10-03 | Primary: Diagnostic Radiology

## 2022-10-02 DIAGNOSIS — R079 Chest pain, unspecified: Secondary | ICD-10-CM

## 2022-10-02 NOTE — ED Notes (Signed)
Pt came back from [place], Carollee Herter [place], [place]  10/02/22 2328

## 2022-10-02 NOTE — ED Notes (Signed)
Pt sitting up in bed using cellphone. NAD noted     Shirlean Schlein, RN  10/02/22 2321

## 2022-10-02 NOTE — ED Triage Notes (Signed)
Pt reports sharp chest pain in the mid sternal area that started yesterday.    Hx of acid reflux per pt.

## 2022-10-02 NOTE — ED Provider Notes (Incomplete)
[facility]  Emergency Department      Patient: Larry Pineda Age: 52 y.o. Sex: male    Date of Birth: 01/24/1971 Admit Date: 10/02/2022 PCP: No primary care provider on file.   MRN: [id number]  CSN: [id number]     Room: H25/H25 Time Dictated: 11:39 PM        Chief Complaint   Chief Complaint   Patient presents with   . Chest Pain       History of Present Illness   This is a 52 y.o. male with history of hyperlipidemia not on medication, smoker who comes to the ED due to chest pain.  Patient states that he was doing okay until 2 days ago when he developed on and off episode of mid chest pain like pressure lasting around 15 minutes when he was working on his car.  He also reports nausea, mild shortness of breath and lightheadedness during the episodes.  Last episode was yesterday.  Today, patient's wife instructed him to come to the ED for further evaluation.  Patient denies vomiting, abdominal pain, worsening of his chronic low back pain, legs pain or swelling, recent travel, recent surgery, cocaine use, or other complaints.  At this moment patient is asymptomatic.    Review of Systems   Review of Systems   Constitutional: Negative.    HENT: Negative.     Eyes: Negative.    Respiratory:  Positive for shortness of breath. Negative for cough.    Cardiovascular:  Positive for chest pain. Negative for leg swelling.   Gastrointestinal:  Positive for nausea. Negative for abdominal pain, diarrhea and vomiting.   Genitourinary: Negative.    Musculoskeletal: Negative.    Skin: Negative.    Neurological:  Positive for light-headedness. Negative for weakness.         Past Medical/Surgical History   No past medical history on file.  No past surgical history on file.  ***  Social History     Social History     Socioeconomic History   . Marital status: Married     Spouse name: Not on file   . Number of children: Not on file   . Years of education: Not on file   . Highest education level: Not on file   Occupational  History   . Not on file   Tobacco Use   . Smoking status: Not on file   . Smokeless tobacco: Not on file   Substance and Sexual Activity   . Alcohol use: Not on file   . Drug use: Not on file   . Sexual activity: Not on file   Other Topics Concern   . Not on file   Social History Narrative   . Not on file     Social Determinants of Health     Financial Resource Strain: Not on file   Food Insecurity: Not on file   Transportation Needs: Not on file   Physical Activity: Not on file   Stress: Not on file   Social Connections: Not on file   Intimate Partner Violence: Not on file   Housing Stability: Not on file     ***  Family History   No family history on file.  ***  Current Medications     No current facility-administered medications for this encounter.     No current outpatient medications on file.       Allergies   No Known Allergies    Physical Exam   Patient  Vitals for the past 24 hrs:   Temp Pulse Resp BP SpO2   10/02/22 2300 -- 88 20 (!) 155/88 98 %   10/02/22 2237 -- 87 17 123/66 99 %   10/02/22 2020 97.3 F (36.3 C) 97 19 120/83 98 %     Physical Exam  Vitals and nursing note reviewed.   Constitutional:       General: He is not in acute distress.     Appearance: Normal appearance. He is normal weight. He is not ill-appearing, toxic-appearing or diaphoretic.   HENT:      Head: Normocephalic and atraumatic.      Nose: Nose normal.      Mouth/Throat:      Mouth: Mucous membranes are moist.   Eyes:      Extraocular Movements: Extraocular movements intact.      Conjunctiva/sclera: Conjunctivae normal.      Pupils: Pupils are equal, round, and reactive to light.   Cardiovascular:      Rate and Rhythm: Normal rate and regular rhythm.      Pulses: Normal pulses.           Posterior tibial pulses are 2+ on the right side and 2+ on the left side.      Heart sounds: Normal heart sounds.   Pulmonary:      Effort: Pulmonary effort is normal.      Breath sounds: Normal breath sounds.   Abdominal:      General: Bowel sounds  are normal.      Palpations: Abdomen is soft.   Musculoskeletal:         General: No swelling or tenderness. Normal range of motion.      Cervical back: Normal range of motion and neck supple.      Right lower leg: No edema.      Left lower leg: No edema.   Skin:     General: Skin is warm and dry.      Capillary Refill: Capillary refill takes less than 2 seconds.   Neurological:      General: No focal deficit present.      Mental Status: He is alert and oriented to person, place, and time.           Impression and Management Plan   52 year old male with history of hyperlipidemia, smoker who comes to the ED due to chest pain.  Differential diagnoses ACS, musculoskeletal, GERD.     Diagnostic Studies   Lab:   Results for orders placed or performed during the hospital encounter of 10/02/22   XR CHEST (2 VW)    Narrative    XR CHEST (2 VW)    Clinical indication: Chest Pain.    Comparison: None    Support devices: None    Lungs: Lungs are clear. No pleural effusions. No pneumothorax.    Cardiomediastinal silhouette: Normal morphology for radiographic technique.     Bones/soft tissues: No acute abnormalities.      Impression    IMPRESSION:      No acute cardiopulmonary process.    Electronically signed by: Linus Galas, DO 10/02/2022 11:35 PM EDT            Workstation ID: [id number]     Comprehensive Metabolic Panel   Result Value Ref Range    Potassium 3.7 3.5 - 5.1 mEq/L    Chloride 107 98 - 107 mEq/L    Sodium 138 136 - 145 mEq/L    CO2 24 20 -  31 mEq/L    Glucose 126 (H) 74 - 106 mg/dl    BUN 16 9 - 23 mg/dl    Creatinine 1.61 0.96 - 1.30 mg/dl    GFR African American >60      GFR Non-African American >60      Calcium 9.6 8.7 - 10.4 mg/dl    Anion Gap 7 5 - 15 mmol/L    AST 28.0 0.0 - 33.9 U/L    ALT 19 10 - 49 U/L    Alkaline Phosphatase 86 46 - 116 U/L    Total Bilirubin 1.20 0.30 - 1.20 mg/dl    Total Protein 7.6 5.7 - 8.2 gm/dl    Albumin 4.6 3.4 - 5.0 gm/dl   CBC with Auto Differential   Result Value Ref  Range    WBC 10.1 4.0 - 11.0 1000/mm3    RBC 5.14 3.80 - 5.70 M/uL    Hemoglobin 15.8 13.0 - 18.0 gm/dl    Hematocrit 04.5 40.9 - 55.0 %    MCV 87.2 80.0 - 98.0 fL    MCH 30.7 23.0 - 34.6 pg    MCHC 35.3 30.0 - 36.0 gm/dl    Platelets 811 914 - 450 1000/mm3    MPV 9.1 6.0 - 10.0 fL    RDW 42.7 35.1 - 43.9      Nucleated RBCs 0 0 - 0      Immature Granulocytes % 0.4 0.0 - 3.0 %    Neutrophils Segmented 64.5 (H) 34 - 64 %    Lymphocytes 24.5 (L) 28 - 48 %    Monocytes 9.3 1 - 13 %    Eosinophils 0.8 0 - 5 %    Basophils 0.5 0 - 3 %   Troponin   Result Value Ref Range    Troponin, High Sensitivity <3 0 - 53 ng/L   Lipase   Result Value Ref Range    Lipase 27 12 - 53 U/L        EKG: Normal sinus rhythm, no diagnostic ST or T-wave changes, no STEMI, no ectopy as interpreted by myself        Imaging: Based on my personal interpretation, the CXR shows no obvious acute cardiopulmonary process seen.    History: 1  ECG: 1  Patient Age: 67  *Risk factors for Atherosclerotic disease: Hypercholesterolemia; Cigarette smoking  Risk Factors: 1  Troponin: 0  Heart Score Total: 4     ED Course           EXTERNAL RECORDS REVIEWED: Not available    PREVIOUS RESULTS REVIEWED: Not available        Severe exacerbation or progression of chronic illness: No      CONDITION POSING Threat to body FUNCTION, LIFE, OR LIMB without evaluation and management: Cardiovascular, pulmonary      SOCIAL DETERMINANTS  impacting Evaluation and Management: Smoker      Comorbidities impacting Evaluation and Management: None      Critical Care Time (if necessary) ***  Is this patient to be included in the SEP-1 core measure? {Sep-1 Core Yes/No:160201}     Medications - No data to display      NARRATIVE:  11:36 PM ED workup is unremarkable.  EKG without acute ischemic changes.  Patient was informed of the results.  Due to elevated heart score and no recent cardiac workup I discussed with the patient to keep him in ED observation for further cardiac evaluation.   Patient wants to discuss with his  wife and will make a final decision after that.    Medical Decision Making   I have spoken to *** regarding this patient's care and we discussed ***    I considered the following testing, treatment, or disposition:   *** but decided not to pursue due to ***    I considered prescribing *** and ***     Final Diagnosis     1. Chest pain, unspecified type         Disposition   ***    Loa Socks, MD   Oct 02, 2022  11:39 PM     My signature above authenticates this document and my orders, the final    diagnosis (es), discharge prescription (s), and instructions in the Epic    record.  If you have any questions please contact [phone].     Nursing notes have been reviewed by the physician/ advanced practice    Clinician.

## 2022-10-02 NOTE — ED Provider Notes (Signed)
[facility]  Emergency Department      Patient: Larry Pineda Age: 52 y.o. Sex: male    Date of Birth: Mar 18, 1971 Admit Date: 10/02/2022 PCP: No primary care provider on file.   MRN: [id number]  CSN: [id number]     Room: H25/H25 Time Dictated: 11:39 PM        Chief Complaint   Chief Complaint   Patient presents with    Chest Pain       History of Present Illness   This is a 52 y.o. male with history of hyperlipidemia not on medication, smoker who comes to the ED due to chest pain.  Patient states that he was doing okay until 2 days ago when he developed on and off episodes of mid chest pain like pressure lasting around 15 minutes when he was working on his car.  He also reports nausea, mild shortness of breath and lightheadedness during the episodes.  Last episode was yesterday.  Today, patient's wife instructed him to come to the ED for further evaluation.  Patient denies vomiting, abdominal pain, worsening of his chronic low back pain, legs pain or swelling, recent travel, recent surgery, cocaine use, or other complaints.  At this moment patient is asymptomatic.    Review of Systems   Review of Systems   Constitutional: Negative.    HENT: Negative.     Eyes: Negative.    Respiratory:  Positive for shortness of breath. Negative for cough.    Cardiovascular:  Positive for chest pain. Negative for leg swelling.   Gastrointestinal:  Positive for nausea. Negative for abdominal pain, diarrhea and vomiting.   Genitourinary: Negative.    Musculoskeletal: Negative.    Skin: Negative.    Neurological:  Positive for light-headedness. Negative for weakness.         Past Medical/Surgical History   No past medical history on file.  No past surgical history on file.    Social History     Social History     Socioeconomic History    Marital status: Married     Spouse name: Not on file    Number of children: Not on file    Years of education: Not on file    Highest education level: Not on file   Occupational History     Not on file   Tobacco Use    Smoking status: Not on file    Smokeless tobacco: Not on file   Substance and Sexual Activity    Alcohol use: Not on file    Drug use: Not on file    Sexual activity: Not on file   Other Topics Concern    Not on file   Social History Narrative    Not on file     Social Determinants of Health     Financial Resource Strain: Not on file   Food Insecurity: Not on file   Transportation Needs: Not on file   Physical Activity: Not on file   Stress: Not on file   Social Connections: Not on file   Intimate Partner Violence: Not on file   Housing Stability: Not on file       Family History   No family history on file.    Current Medications     No current facility-administered medications for this encounter.     No current outpatient medications on file.       Allergies   No Known Allergies    Physical Exam   Patient  Vitals for the past 24 hrs:   Temp Pulse Resp BP SpO2   10/02/22 2300 -- 88 20 (!) 155/88 98 %   10/02/22 2237 -- 87 17 123/66 99 %   10/02/22 2020 97.3 F (36.3 C) 97 19 120/83 98 %     Physical Exam  Vitals and nursing note reviewed.   Constitutional:       General: He is not in acute distress.     Appearance: Normal appearance. He is normal weight. He is not ill-appearing, toxic-appearing or diaphoretic.   HENT:      Head: Normocephalic and atraumatic.      Nose: Nose normal.      Mouth/Throat:      Mouth: Mucous membranes are moist.   Eyes:      Extraocular Movements: Extraocular movements intact.      Conjunctiva/sclera: Conjunctivae normal.      Pupils: Pupils are equal, round, and reactive to light.   Cardiovascular:      Rate and Rhythm: Normal rate and regular rhythm.      Pulses: Normal pulses.           Posterior tibial pulses are 2+ on the right side and 2+ on the left side.      Heart sounds: Normal heart sounds.   Pulmonary:      Effort: Pulmonary effort is normal.      Breath sounds: Normal breath sounds.   Abdominal:      General: Bowel sounds are normal.       Palpations: Abdomen is soft.   Musculoskeletal:         General: No swelling or tenderness. Normal range of motion.      Cervical back: Normal range of motion and neck supple.      Right lower leg: No edema.      Left lower leg: No edema.   Skin:     General: Skin is warm and dry.      Capillary Refill: Capillary refill takes less than 2 seconds.   Neurological:      General: No focal deficit present.      Mental Status: He is alert and oriented to person, place, and time.           Impression and Management Plan   52 year old male with history of hyperlipidemia, smoker who comes to the ED due to chest pain.  Differential diagnoses ACS, musculoskeletal, GERD.     Diagnostic Studies   Lab:   Results for orders placed or performed during the hospital encounter of 10/02/22   XR CHEST (2 VW)    Narrative    XR CHEST (2 VW)    Clinical indication: Chest Pain.    Comparison: None    Support devices: None    Lungs: Lungs are clear. No pleural effusions. No pneumothorax.    Cardiomediastinal silhouette: Normal morphology for radiographic technique.     Bones/soft tissues: No acute abnormalities.      Impression    IMPRESSION:      No acute cardiopulmonary process.    Electronically signed by: Linus Galas, DO 10/02/2022 11:35 PM EDT            Workstation ID: [id number]     Comprehensive Metabolic Panel   Result Value Ref Range    Potassium 3.7 3.5 - 5.1 mEq/L    Chloride 107 98 - 107 mEq/L    Sodium 138 136 - 145 mEq/L    CO2 24 20 -  31 mEq/L    Glucose 126 (H) 74 - 106 mg/dl    BUN 16 9 - 23 mg/dl    Creatinine 6.57 8.46 - 1.30 mg/dl    GFR African American >60      GFR Non-African American >60      Calcium 9.6 8.7 - 10.4 mg/dl    Anion Gap 7 5 - 15 mmol/L    AST 28.0 0.0 - 33.9 U/L    ALT 19 10 - 49 U/L    Alkaline Phosphatase 86 46 - 116 U/L    Total Bilirubin 1.20 0.30 - 1.20 mg/dl    Total Protein 7.6 5.7 - 8.2 gm/dl    Albumin 4.6 3.4 - 5.0 gm/dl   CBC with Auto Differential   Result Value Ref Range    WBC 10.1  4.0 - 11.0 1000/mm3    RBC 5.14 3.80 - 5.70 M/uL    Hemoglobin 15.8 13.0 - 18.0 gm/dl    Hematocrit 96.2 95.2 - 55.0 %    MCV 87.2 80.0 - 98.0 fL    MCH 30.7 23.0 - 34.6 pg    MCHC 35.3 30.0 - 36.0 gm/dl    Platelets 841 324 - 450 1000/mm3    MPV 9.1 6.0 - 10.0 fL    RDW 42.7 35.1 - 43.9      Nucleated RBCs 0 0 - 0      Immature Granulocytes % 0.4 0.0 - 3.0 %    Neutrophils Segmented 64.5 (H) 34 - 64 %    Lymphocytes 24.5 (L) 28 - 48 %    Monocytes 9.3 1 - 13 %    Eosinophils 0.8 0 - 5 %    Basophils 0.5 0 - 3 %   Troponin   Result Value Ref Range    Troponin, High Sensitivity <3 0 - 53 ng/L   Lipase   Result Value Ref Range    Lipase 27 12 - 53 U/L        EKG: Normal sinus rhythm, no diagnostic ST or T-wave changes, no STEMI, no ectopy as interpreted by myself        Imaging: Based on my personal interpretation, the CXR shows no obvious acute cardiopulmonary process seen.    History: 1  ECG: 1  Patient Age: 79  *Risk factors for Atherosclerotic disease: Hypercholesterolemia; Cigarette smoking  Risk Factors: 1  Troponin: 0  Heart Score Total: 4     ED Course           EXTERNAL RECORDS REVIEWED: Not available    PREVIOUS RESULTS REVIEWED: Not available        Severe exacerbation or progression of chronic illness: No      CONDITION POSING Threat to body FUNCTION, LIFE, OR LIMB without evaluation and management: Cardiovascular, pulmonary      SOCIAL DETERMINANTS  impacting Evaluation and Management: Smoker      Comorbidities impacting Evaluation and Management: None        Medications - No data to display      NARRATIVE:  11:36 PM ED workup is unremarkable.  EKG without acute ischemic changes.  Patient was informed of the results.  Due to elevated heart score and no recent cardiac workup I discussed with the patient to keep him in ED observation for further cardiac evaluation.  Patient wants to discuss with his wife and will make a final decision after that.    12:41 AM Patient has not been able to talk with his wife  but he agreed to stay in ED observation for further cardiac evaluation.    Medical Decision Making   52 year old male with history of hyperlipidemia, smoker who came to the ED due to chest pain.  ED workup was unremarkable.  Patient with elevated heart score and no recent cardiac workup.  He will be placed in ED observation for further cardiac evaluation in the morning.    Final Diagnosis     1. Chest pain, unspecified type         Disposition   ED OBS    Loa Socks, MD   Oct 02, 2022  11:39 PM     My signature above authenticates this document and my orders, the final    diagnosis (es), discharge prescription (s), and instructions in the Epic    record.  If you have any questions please contact [phone].     Nursing notes have been reviewed by the physician/ advanced practice    Clinician.                            Loa Socks, MD  10/03/22 [phone]

## 2022-10-03 ENCOUNTER — Inpatient Hospital Stay
Admission: EM | Admit: 2022-10-03 | Discharge: 2022-10-03 | Disposition: A | Discharge status: Home / Self Care | Admitting: Emergency Medicine

## 2022-10-03 ENCOUNTER — Observation Stay: Admit: 2022-10-03 | Primary: Diagnostic Radiology

## 2022-10-03 LAB — CBC WITH AUTO DIFFERENTIAL
Basophils: 0.5 % (ref 0–3)
Eosinophils: 0.8 % (ref 0–5)
Hematocrit: 44.8 % (ref 36.0–55.0)
Hemoglobin: 15.8 gm/dl (ref 13.0–18.0)
Immature Granulocytes %: 0.4 % (ref 0.0–3.0)
Lymphocytes: 24.5 % — ABNORMAL LOW (ref 28–48)
MCH: 30.7 pg (ref 23.0–34.6)
MCHC: 35.3 gm/dl (ref 30.0–36.0)
MCV: 87.2 fL (ref 80.0–98.0)
MPV: 9.1 fL (ref 6.0–10.0)
Monocytes: 9.3 % (ref 1–13)
Neutrophils Segmented: 64.5 % — ABNORMAL HIGH (ref 34–64)
Nucleated RBCs: 0 (ref 0–0)
Platelets: 336 10*3/uL (ref 140–450)
RBC: 5.14 M/uL (ref 3.80–5.70)
RDW: 42.7 (ref 35.1–43.9)
WBC: 10.1 10*3/uL (ref 4.0–11.0)

## 2022-10-03 LAB — COMPREHENSIVE METABOLIC PANEL
ALT: 19 U/L (ref 10–49)
AST: 28 U/L (ref 0.0–33.9)
Albumin: 4.6 gm/dl (ref 3.4–5.0)
Alkaline Phosphatase: 86 U/L (ref 46–116)
Anion Gap: 7 mmol/L (ref 5–15)
BUN: 16 mg/dl (ref 9–23)
CO2: 24 mEq/L (ref 20–31)
Calcium: 9.6 mg/dl (ref 8.7–10.4)
Chloride: 107 mEq/L (ref 98–107)
Creatinine: 1.09 mg/dl (ref 0.70–1.30)
GFR African American: >60
GFR Non-African American: >60
Glucose: 126 mg/dl — ABNORMAL HIGH (ref 74–106)
Potassium: 3.7 mEq/L (ref 3.5–5.1)
Sodium: 138 mEq/L (ref 136–145)
Total Bilirubin: 1.2 mg/dl (ref 0.30–1.20)
Total Protein: 7.6 gm/dl (ref 5.7–8.2)

## 2022-10-03 LAB — EKG 12-LEAD
Atrial Rate: 90 {beats}/min
Calculated P Axis: 86 degrees
Calculated R Axis: -28 degrees
Calculated T Axis: 48 degrees
DIAGNOSIS, 93000: NORMAL
P-R Interval: 156 ms
Q-T Interval: 358 ms
QRS Duration: 80 ms
QTC Calculation (Bezet): 437 ms
Ventricular Rate: 90 {beats}/min

## 2022-10-03 LAB — TROPONIN
Troponin, High Sensitivity: <3 ng/L (ref 0–53)
Troponin, High Sensitivity: <3 ng/L (ref 0–53)

## 2022-10-03 LAB — LIPASE: Lipase: 27 U/L (ref 12–53)

## 2022-10-03 MED ORDER — NITROGLYCERIN 0.4 MG/SPRAY TL SOLN
0.4 | Freq: Once | Status: AC
Start: 2022-10-03 — End: 2022-10-03
  Administered 2022-10-03: 17:00:00 2 via SUBLINGUAL

## 2022-10-03 MED ORDER — IOPAMIDOL 76 % IV SOLN
76 | Freq: Once | INTRAVENOUS | Status: AC | PRN
Start: 2022-10-03 — End: 2022-10-03
  Administered 2022-10-03: 17:00:00 100 mL via INTRAVENOUS

## 2022-10-03 MED ORDER — ACETAMINOPHEN 500 MG PO TABS
500 | ORAL | Status: AC
Start: 2022-10-03 — End: 2022-10-03
  Administered 2022-10-03: 14:00:00 1000 mg via ORAL

## 2022-10-03 MED ORDER — METOPROLOL TARTRATE 100 MG PO TABS
100 | ORAL | Status: AC
Start: 2022-10-03 — End: 2022-10-03
  Administered 2022-10-03: 14:00:00 100 mg via ORAL

## 2022-10-03 MED ORDER — ASPIRIN 81 MG PO CHEW
81 | Freq: Once | ORAL | Status: AC
Start: 2022-10-03 — End: 2022-10-03
  Administered 2022-10-03: 05:00:00 324 mg via ORAL

## 2022-10-03 MED FILL — METOPROLOL TARTRATE 100 MG PO TABS: 100 MG | ORAL | Qty: 1

## 2022-10-03 MED FILL — ASPIRIN 81 MG PO CHEW: 81 MG | ORAL | Qty: 4

## 2022-10-03 MED FILL — ACETAMINOPHEN EXTRA STRENGTH 500 MG PO TABS: 500 MG | ORAL | Qty: 2

## 2022-10-03 MED FILL — NITROGLYCERIN 0.4 MG/SPRAY TL SOLN: 0.4 MG/SPRAY | Qty: 4.9

## 2022-10-03 MED FILL — ISOVUE-370 76 % IV SOLN: 76 % | INTRAVENOUS | Qty: 100

## 2022-10-03 NOTE — ED Notes (Addendum)
Emergency Department  Cardiac CTA: Nurse Worksheet    Patient Location:   []   OP   [x]   EDOBS    IP Room 112/EO12    Nurse: Theodosia Blender, RN    Referring Physician: Wynelle Bourgeois, MD     Reason for Study:  Chest Pain        Patient: Larry Pineda Age: 52 y.o. Sex: male    Date of Birth: 1970-08-11 Admit Date: 10/02/2022 PCP: Unknown, Provider, APRN - NP   MRN: [id number]  CSN: [id number]       Height/Weight: Body mass index is 21.02 kg/m.   Weight:     Weight - Scale: 70.3 kg (155 lb)  Weight Source:      GFR (must be within 30 days): [x]  Yes  []  No             Result:    Lab Results   Component Value Date/Time    GFRAA >60 10/02/2022 08:32 PM       No components found for: "GFRNA"    Allergies:  No Known Allergies                   Premedication, if IV contrast allergy: none      Beta blocker/Calcium channel blocker in last 24 hours: if yes: none    []   Prior CABG, Stent, Pacemaker, or ICD             []   Atrial Fibrillation or Flutter  []   LV <40% or CHF                                                      []   Viagra, Cialis, Adcirca, Tadalafil < 72 hrs  []  Severe Aortic Stenosis                                                   []   Diabetic on metformin  []  Hypertrophic Cardiomyopathy     []   Pregnancy  []  AV Block without Pacemaker                                   []   IV contrast in last 24 hours                                                  Contraindications to Cardiac CTA medication:    Metoprolol (preferred) Ivabradine (alternative 1) Diltiazem (alternative 2) Nitroglycerin   []  HR < 55 OR SBP < 100 mmHg  []   HR < 60 or SBP < 90 mmHg   []  HR < 55 OR SBP <  []  SBP < 80 mmHg   []  Allergy to Metoprolol  []   Allergy to Ivabradine   []  Allergy to Diltiazem  []  SBP 80-100 mmHg ** 1 Spray   []  IV ejection fraction < 40% or Acute CHF  []   "-azole" antifunglas, "-mycin" antibiotics and  HIV protease inhibitors   []  IV ejection fraction < 40% or Acute CHF  [x]  SBP > 100 mmHg  ** 2 Sprays   []  Severe  Aortic Stenosis  []   Severe liver disease   []  Severe Aortic stenosis  []  Allergy to Nitroglycerin   []  Any degree of AV block without pacemaker  []   Any degree of SA/AV block without pacemaker   []  Any degree of AV block without pacemaker  []  Hypertrophic cardiomyopathy   []  Asthma or COPD requiring inhalers in the last 3 months  []   Pregnancy, breast feeding   []  Severe Aortic Stenosis    []  Viagra, Cialis, Levitra < 72 hrs ago      [x]  No contraindication to Metoprolol  [x]   No contraindication to Ivabradine   [x]  No contraindication to Diltiazem  [x]  No contraindication to Nitroglycerin     Vital Signs (upon arrival and q45 min post medication administration)      Time: 0850 0955 1115 1228 1244  1249      BP 126/73  105/72 102/80        HR 76  69 66 53       HR with BH 69      55     Medication  Metoprolol 100mg    Nitro x2        Route  P.O. or IV  po    po scan         Theodosia Blender, RN  10/03/22 0909       Theodosia Blender, RN  10/03/22 1441

## 2022-10-03 NOTE — ED Notes (Signed)
Visitor at bedside, NAD noted.      Khloie Hamada, Jearld Lesch, RN  10/03/22 [phone]

## 2022-10-03 NOTE — ED Notes (Signed)
Pt given d/c instructiins voiced understanding     Thomasenia Sales, RN  10/03/22 1404

## 2022-10-03 NOTE — Progress Notes (Signed)
Rounded on pt. Introduced self. Pt eager to eat. No further concerns or needs expressed at this time.

## 2022-10-03 NOTE — ED Notes (Signed)
Patient moved to ED Obs bed 12, placed in gown and on cardiac monitor. Steps to CTA study reviewed with patient and patient in agreement with study. Shift report received from [facility].      Theodosia Blender, RN  10/03/22 [phone]

## 2022-10-03 NOTE — ED Notes (Signed)
Pt sleeping, NAD noted     Shirlean Schlein, [place]  10/03/22 [phone]

## 2022-10-03 NOTE — ED Notes (Signed)
CT Called again. Patient continuing to wait for CT availability for study.      Theodosia Blender, RN  10/03/22 1227

## 2022-10-03 NOTE — ED Notes (Signed)
CT called, made aware patient is ready for his CTA. Awaiting CT table availability at this time. Patient made aware. PIV placed to LUE for CT Study.      Theodosia Blender, RN  10/03/22 1215

## 2022-10-03 NOTE — ED Notes (Addendum)
Report received from [place], [place]. Introduced self to patient, patient aware he is NPO. Patient requesting nothing at this time.      Pamalee Leyden, RN  10/03/22 [phone]

## 2022-10-03 NOTE — Discharge Summary (Signed)
[facility]  Emergency ObservationDepartment   Chest Pain Discharge Summary    Patient: Larry Pineda Age: 52 y.o. Sex: male    Date of Birth: 1970-11-02 Admit Date: 10/02/2022 PCP: Unknown, Provider, APRN - NP   MRN: [id number]  CSN: [id number]     Room: 112/EO12       Discharge Physician:   Wynelle Bourgeois, MD    Date/Time of ED Observation Admission:  ED Events       Date/Time Event User Comments    10/03/22 [phone] ED Observation Disposition [place] [facility] Disposition set to ED Observation    10/03/22 0042 ED Observation Disposition [place] ED Disposition set to ED Observation          Date/Time of ED Observation Discharge:  Oct 03, 2022 1:52 PM    Disposition:  Discharged Home     History of Present Illness   52 y.o. male was seen in the ED for evaulation of chest pain.  The initial EKG and troponin were unremarkable.  They were assigned to ED observation for telemetry monitoring, re-evaluations and Coronary CT angiography.    ED Observation Course     MORE THAN 30 MINUTES OF TIME WAS SPENT ON DISCHARGE SERVICES    Telemetry monitoring was unrevealing with the exception of developing a sinus bradycardia after PO medication per protocol was given to slow heart rate for study.    My independent interpretation of cardiac monitoring at time of discharge is sinus rhythm rate 62 no ectopy.    Discussed with patient that they have evidence of non-obstructive coronary artery disease found on CTA that does not meet any criteria for emergent cardiac catheterization but should be managed medically.  We discussed risk factor modification and that they should follow-up with their primary care physician as soon as possible to discuss any medical management recommendations such as statin, beta blocker therapy, aspirin.     CTA CARDIAC W C STRC MORP W CONTRAST   Final Result   IMPRESSION:      1. Mild non-obstructive stenosis or plaque (25-49%) in the proximal LAD. Acute   Coronary  Syndrome(ACS) is unlikely. Management recommendation: Consider   evaluation of non-ACS etiology, if normal troponin and no ECG changes.  If   clinical suspicion of ACS is high or if high-risk plaque features are noted,   consider hospital admission with cardiology consultation. Otherwise, consider   referral for outpatient follow-up for preventative therapy and risk factor   modification.       2. Acquired images reveal no extra-cardiac pathology.      *Guideline-directed care per [facility] Stable Ischemic Heart Disease Guidelines [facility]   SD et al. Tillman Sers 2012;60:[id number]).      **This test is performed for the purposes of CHD detection and risk assessment   on a patient who has concerning symptoms or who has a greater than low CHD   pretest risk.      Thank you for referring this patient to [place]. Please feel free to contact me at   [phone] RAD-LINE [phone]), if you have any questions or would like to   discuss this case further.             Electronically signed by: Rolinda Roan, MD 10/03/2022 1:23 PM EDT             Workstation ID: [id number]         XR CHEST (2 VW)   Final Result   IMPRESSION:  No acute cardiopulmonary process.      Electronically signed by: Linus Galas, DO 10/02/2022 11:35 PM EDT             Workstation ID: [id number]             Patient Vitals for the past 24 hrs:   BP Temp Temp src Pulse Resp SpO2 Height Weight   10/03/22 0856 126/73 97.5 F (36.4 C) Oral 76 18 99 % -- --   10/03/22 0645 127/74 -- -- 82 16 100 % -- --   10/03/22 0100 134/82 -- -- 89 20 100 % -- --   10/02/22 2300 (!) 155/88 -- -- 88 20 98 % -- --   10/02/22 2237 123/66 -- -- 87 17 99 % -- --   10/02/22 2020 120/83 97.3 F (36.3 C) Oral 97 19 98 % 1.829 m (6') 70.3 kg (155 lb)       Physical Exam     Vitals:    10/03/22 0856   BP: 126/73   Pulse: 76   Resp: 18   Temp: 97.5 F (36.4 C)   SpO2: 99%       Physical Exam  Constitutional:       Appearance: He is not ill-appearing or toxic-appearing.   Cardiovascular:      Rate  and Rhythm: Normal rate and regular rhythm.      Pulses: Normal pulses.   Abdominal:      General: Abdomen is flat.      Palpations: Abdomen is soft.      Tenderness: There is no abdominal tenderness.   Neurological:      General: No focal deficit present.      Mental Status: He is alert and oriented to person, place, and time.         Clinical Impression/Diagnosis     1. Chest pain, unspecified type    2. History of hyperlipidemia    3. History of tobacco use    4. Non-occlusive coronary artery disease        Disposition and Plan   Patient is discharged home in stable condition. They were advised to follow-up with primary care/specialist. Copies of all test results were provided to the patient to take with them to their appointment.      Patient advised to return to the ED for any new or worsening symptoms.    Follow-up Information       Follow up With Specialties Details Why Contact Info    [facility]  Schedule an appointment as soon as possible for a visit  for follow-up of today's visit, for primary care [address] [place] [address]  [phone]    Burgess Estelle, MD Cardiology Schedule an appointment as soon as possible for a visit  for follow-up of today's visit- follow-up of coronary artery disease [address]  [address]  [place] [address]  [phone]                  The patient was personally evaluated by myself and discussed with Dr. Sherlon Handing, Sherie Don, MD who agrees with the above assessment and plan.    Marlana Salvage, PA-C  Oct 03, 2022    My signature above authenticates this document and my orders, the final    diagnosis (es), discharge prescription (s), and instructions in the Epic    record.  If you have any questions please contact [phone].     Nursing  notes have been reviewed by the physician/ advanced practice    Clinician.    Dragon medical dictation software was used for portions of this report. Unintended voice recognition errors may  occur.

## 2022-10-03 NOTE — ED Notes (Signed)
Pt awaiting CTA     Anyelo Mccue, RN  10/03/22 1113

## 2022-10-03 NOTE — ED Notes (Signed)
Pt sleeping, NAD noted. Pt was brought food earlier by spouse and is aware he is NPO and is sitting next to him unopened     Shirlean Schlein, RN  10/03/22 [phone]

## 2022-10-03 NOTE — Discharge Instructions (Signed)
Follow-up with your doctor within the next week to discuss your test results, symptoms, any further treatment recommendations.    You have plaque in one of the arteries of your heart- it is important that you follow-up with your primary care doctor and a cardiologist to discuss further management/medications to prevent this from worsening.   You should try to quit smoking as it can lead to worsening plaque in your arteries and other health problems.    Return to the ER for fever, increased pain, inability to hold down fluids, concerns for dehydration, difficulty breathing, worsening condition.    Results for orders placed or performed during the hospital encounter of 10/02/22   XR CHEST (2 VW)    Narrative    XR CHEST (2 VW)    Clinical indication: Chest Pain.    Comparison: None    Support devices: None    Lungs: Lungs are clear. No pleural effusions. No pneumothorax.    Cardiomediastinal silhouette: Normal morphology for radiographic technique.     Bones/soft tissues: No acute abnormalities.      Impression    IMPRESSION:      No acute cardiopulmonary process.    Electronically signed by: Linus Galas, DO 10/02/2022 11:35 PM EDT            Workstation ID: [id number]     CTA CARDIAC W C STRC MORP W CONTRAST    Narrative    INDICATION: Chest pain. Evaluate for coronary artery stenosis.**    TECHNIQUE: [employer] third generation dual source CT scanner. Computed  tomography (CT) of the heart was obtained using electrocardiography prospective  (ECG) triggering initially without the use of contrast media for calcium  scoring. Subsequently, a series of unenhanced images through the descending  thoracic aorta were obtained for bolus tracking purposes. Then, CT angiography  of the heart with IV contrast was performed utilizing ECG-gating.     All CT exams at this facility use one or more dose reduction techniques  including automatic exposure control, mA/kV adjustment per patient's size, or  iterative  reconstruction technique.    No iStat was needed.    100 ml of Isovue 370 non-ionic IV contrast was administered. No adverse  reaction.    3D advanced post-processing was performed by the interpreting physician using an  independent workstation. A combination of MIP, MPR, curved MPR and volume  rendered techniques were evaluated to better evaluate anatomy and potential  pathology.    100 mg of oral Metoprolol tartrate was administered prior to the scan for heart  rate lowering and regulation.  No IV medication was required.  0.8 mg sublingual Nitroglycerin was administered 5 minutes prior to the CT scan  acquisition.  MHR: 55 bpm  BMI: 21 kg/m2    FINDINGS:     CORONARY ARTERIES:    CALCIUM SCORE (AGATSTON METHOD): 60.2.    LEFT MAIN: Left main coronary artery has a conventional origin from the left  coronary sinus. It bifurcates into LAD and LCX vessels. There is no evidence of  atherosclerotic plaque or stenosis.     LEFT ANTERIOR DESCENDING(LAD): The LAD is a large caliber vessel that extends to  the left ventricular apex. Calcified plaque is seen focally in the proximal LAD  causing mild stenosis. A medium caliber diagonal branch has a calcified focal  plaque in the proximal segment causing minimal stenosis.    RAMUS INTERMEDIUS (RI): Absent.    LEFT CIRCUMFLEX (LCX): The LCX is a medium caliber non-dominant vessel.  There is  no evidence of atherosclerotic plaque or stenosis. Medium caliber obtuse  marginal branch is free of disease.    RIGHT CORONARY ARTERY (RCA): The RCA is a large caliber dominant vessel. It has  a conventional origin from the right coronary sinus. There is no evidence of  atherosclerotic plaque or stenosis.       CARDIAC STRUCTURES:   There is normal atrioventricular and ventriculo-arterial concordance. Cardiac  chamber sizes are normal. No pericardial effusion, calcification or thickening.      NON-CARDIAC STRUCTURES:    Imaged lungs, mediastinum, pulmonary artery, thoracic aorta, osseous  structures  and chest wall are within normal limits.       Impression    IMPRESSION:    1. Mild non-obstructive stenosis or plaque (25-49%) in the proximal LAD. Acute  Coronary Syndrome(ACS) is unlikely. Management recommendation: Consider  evaluation of non-ACS etiology, if normal troponin and no ECG changes.  If  clinical suspicion of ACS is high or if high-risk plaque features are noted,  consider hospital admission with cardiology consultation. Otherwise, consider  referral for outpatient follow-up for preventative therapy and risk factor  modification.     2. Acquired images reveal no extra-cardiac pathology.    *Guideline-directed care per [facility] Stable Ischemic Heart Disease Guidelines [facility]  SD et al. Tillman Sers 2012;60:[id number]).    **This test is performed for the purposes of CHD detection and risk assessment  on a patient who has concerning symptoms or who has a greater than low CHD  pretest risk.    Thank you for referring this patient to [place]. Please feel free to contact me at  [phone] RAD-LINE [phone]), if you have any questions or would like to  discuss this case further.         Electronically signed by: Rolinda Roan, MD 10/03/2022 1:23 PM EDT            Workstation ID: [id number]     Comprehensive Metabolic Panel   Result Value Ref Range    Potassium 3.7 3.5 - 5.1 mEq/L    Chloride 107 98 - 107 mEq/L    Sodium 138 136 - 145 mEq/L    CO2 24 20 - 31 mEq/L    Glucose 126 (H) 74 - 106 mg/dl    BUN 16 9 - 23 mg/dl    Creatinine 5.62 1.30 - 1.30 mg/dl    GFR African American >60      GFR Non-African American >60      Calcium 9.6 8.7 - 10.4 mg/dl    Anion Gap 7 5 - 15 mmol/L    AST 28.0 0.0 - 33.9 U/L    ALT 19 10 - 49 U/L    Alkaline Phosphatase 86 46 - 116 U/L    Total Bilirubin 1.20 0.30 - 1.20 mg/dl    Total Protein 7.6 5.7 - 8.2 gm/dl    Albumin 4.6 3.4 - 5.0 gm/dl   CBC with Auto Differential   Result Value Ref Range    WBC 10.1 4.0 - 11.0 1000/mm3    RBC 5.14 3.80 - 5.70 M/uL    Hemoglobin 15.8 13.0 - 18.0  gm/dl    Hematocrit 86.5 78.4 - 55.0 %    MCV 87.2 80.0 - 98.0 fL    MCH 30.7 23.0 - 34.6 pg    MCHC 35.3 30.0 - 36.0 gm/dl    Platelets 696 295 - 450 1000/mm3    MPV 9.1 6.0 - 10.0 fL    RDW 42.7  35.1 - 43.9      Nucleated RBCs 0 0 - 0      Immature Granulocytes % 0.4 0.0 - 3.0 %    Neutrophils Segmented 64.5 (H) 34 - 64 %    Lymphocytes 24.5 (L) 28 - 48 %    Monocytes 9.3 1 - 13 %    Eosinophils 0.8 0 - 5 %    Basophils 0.5 0 - 3 %   Troponin   Result Value Ref Range    Troponin, High Sensitivity <3 0 - 53 ng/L   Troponin   Result Value Ref Range    Troponin, High Sensitivity <3 0 - 53 ng/L   Lipase   Result Value Ref Range    Lipase 27 12 - 53 U/L   EKG 12 Lead within 10 mins of arrival   Result Value Ref Range    Ventricular Rate 90 BPM    Atrial Rate 90 BPM    P-R Interval 156 ms    QRS Duration 80 ms    Q-T Interval 358 ms    QTC Calculation (Bezet) 437 ms    Calculated P Axis 86 degrees    Calculated R Axis -28 degrees    Calculated T Axis 48 degrees    DIAGNOSIS, 93[address]       Normal sinus rhythm  Septal infarct , age undetermined  Abnormal ECG  No previous ECGs available  Confirmed by Williams Che, Mohsin ([id number]) on 10/03/2022 7:24:31 AM         DISCHARGE INSTRUCTION AFTER HAVING YOUR STRESS TEST:    It was discussed with you that your cardiac evaluation was unremarkable and does not indicate that immediate intervention is necessary. You were counseled that the testing is not 100 percent accurate and may generate false negatives. Should you have continued, new, or worsening symptoms, such as chest pain or shortness of breath they should seek treatment immediately. You should see your primary care provider in 1 week for follow up.      Cardiac Stress Test      WHAT YOU SHOULD KNOW:    A cardiac stress test is also called an exercise test or a treadmill test. This test may help your caregiver see how well your heart works during exercise. Exercise is a form of body stress. The longer you exercise, the harder your body  needs to work. The heart must work double time to pump more blood to supply the body with more oxygen. A cardiac stress test may check for risks of a possible heart problem or diagnose an existing heart problem. It will also tell caregivers the type and level of exercise that will be best for you.      During a cardiac stress test, you may be asked to exercise on a stationary bicycle, or to walk or jog on a treadmill. While doing this, your heart will be watched on a monitor. An electrocardiogram (ECG) is used to have tracings of your heart activity. Your caregiver will be watching you during this test. Your breathing, blood pressure, and heart rate are also checked while you are exercising. You may get tired, have trouble breathing, or start having chest pains while doing the test. Your caregiver may give you medicines or ask you not to continue doing the test.   INSTRUCTIONS:    Take your medicine as directed: Call your primary healthcare provider if you think your medicine is not working as expected. Tell him if you are allergic to  any medicine. Keep a current list of the medicines, vitamins, and herbs you take. Include the amounts, and when, how, and why you take them. Take the list or the pill bottles to follow-up visits. Carry your medicine list with you in case of an emergency. Throw away old medicine lists.    Ask for information about where and when to go for follow-up visits: For continuing care, treatments, or home services, ask for more information.     Lifestyle changes: After the test results are looked at, caregivers may suggest that you make lifestyle changes such as the following:     Diet changes: Eat a variety of healthy foods such as fruits, vegetables, whole-grain breads, low-fat dairy products, beans, lean meat and fish. Eating healthy foods may help you have more energy. Ask your caregiver if you need to be on a special diet.      Drinking liquids: Adults should drink about 9 to 13 cups of  liquid each day. One cup is 8 ounces. Good choices of liquids for most people include water, juice, and milk. Coffee, soup, and fruit may be counted in your daily liquid amount. Ask your caregiver how much liquid you should drink each day.       Start exercising: Talk to your caregiver before you start exercising. Together you can plan the best exercise program for you. It is best to start slowly and do more as you get stronger. Exercising can help make your heart stronger, lower your blood pressure, and keep you healthy.      Quit smoking: It is never too late to quit smoking. Smoking harms the heart, lungs, and the blood. You are more likely to have a heart attack, lung disease, and cancer if you smoke. You will help yourself and those around you by not smoking. Ask your caregiver for more information about how to stop smoking if you are having trouble quitting.    CONTACT A CAREGIVER IF:    You feel dizzy and lightheaded.      You have nausea (upset stomach) or vomiting (throwing up).      You have questions or concerns about your procedure, or medicine.  SEEK CARE IMMEDIATELY IF:    You have trouble breathing all of a sudden.      You have chest pain even after taking medicines.      [id number]  [employer]. All rights reserved.
